# Patient Record
Sex: Female | Born: 1955
Health system: Southern US, Community
[De-identification: ages and names within clinical notes are randomized; demographics above are authoritative.]

## PROBLEM LIST (undated history)

## (undated) DIAGNOSIS — I2510 Atherosclerotic heart disease of native coronary artery without angina pectoris: Secondary | ICD-10-CM

## (undated) DIAGNOSIS — Z720 Tobacco use: Secondary | ICD-10-CM

## (undated) DIAGNOSIS — I219 Acute myocardial infarction, unspecified: Secondary | ICD-10-CM

## (undated) DIAGNOSIS — I6529 Occlusion and stenosis of unspecified carotid artery: Secondary | ICD-10-CM

## (undated) DIAGNOSIS — E785 Hyperlipidemia, unspecified: Secondary | ICD-10-CM

## (undated) DIAGNOSIS — M1990 Unspecified osteoarthritis, unspecified site: Secondary | ICD-10-CM

## (undated) HISTORY — PX: BREAST BIOPSY: SHX20

## (undated) HISTORY — DX: Occlusion and stenosis of unspecified carotid artery: I65.29

## (undated) HISTORY — DX: Atherosclerotic heart disease of native coronary artery without angina pectoris: I25.10

## (undated) HISTORY — DX: Hyperlipidemia, unspecified: E78.5

## (undated) HISTORY — DX: Tobacco use: Z72.0

---

## 2002-08-21 ENCOUNTER — Other Ambulatory Visit: Admission: RE | Admit: 2002-08-21 | Discharge: 2002-08-21 | Payer: Self-pay | Admitting: Internal Medicine

## 2005-02-07 ENCOUNTER — Ambulatory Visit (HOSPITAL_COMMUNITY): Admission: RE | Admit: 2005-02-07 | Discharge: 2005-02-07 | Payer: Self-pay | Admitting: Family Medicine

## 2006-12-01 ENCOUNTER — Emergency Department (HOSPITAL_COMMUNITY): Admission: EM | Admit: 2006-12-01 | Discharge: 2006-12-01 | Payer: Self-pay | Admitting: Emergency Medicine

## 2016-07-18 ENCOUNTER — Ambulatory Visit
Admission: RE | Admit: 2016-07-18 | Discharge: 2016-07-18 | Disposition: A | Discharge status: Home / Self Care | Payer: 59 | Source: Ambulatory Visit | Attending: Chiropractic Medicine | Admitting: Chiropractic Medicine

## 2016-07-18 ENCOUNTER — Other Ambulatory Visit: Payer: Self-pay | Admitting: Chiropractic Medicine

## 2016-07-18 DIAGNOSIS — M9903 Segmental and somatic dysfunction of lumbar region: Secondary | ICD-10-CM | POA: Insufficient documentation

## 2016-07-18 DIAGNOSIS — R52 Pain, unspecified: Secondary | ICD-10-CM

## 2016-07-18 DIAGNOSIS — R200 Anesthesia of skin: Secondary | ICD-10-CM

## 2016-07-18 DIAGNOSIS — M5442 Lumbago with sciatica, left side: Secondary | ICD-10-CM | POA: Insufficient documentation

## 2016-07-18 DIAGNOSIS — M5137 Other intervertebral disc degeneration, lumbosacral region: Secondary | ICD-10-CM | POA: Diagnosis not present

## 2016-08-14 ENCOUNTER — Other Ambulatory Visit (HOSPITAL_COMMUNITY): Payer: Self-pay | Admitting: Neurological Surgery

## 2016-08-14 DIAGNOSIS — M542 Cervicalgia: Secondary | ICD-10-CM

## 2016-08-14 DIAGNOSIS — M546 Pain in thoracic spine: Secondary | ICD-10-CM

## 2016-08-14 DIAGNOSIS — M5416 Radiculopathy, lumbar region: Secondary | ICD-10-CM

## 2016-08-15 ENCOUNTER — Other Ambulatory Visit (HOSPITAL_COMMUNITY): Payer: Self-pay | Admitting: Neurological Surgery

## 2016-08-15 DIAGNOSIS — M5416 Radiculopathy, lumbar region: Secondary | ICD-10-CM

## 2016-08-15 DIAGNOSIS — M542 Cervicalgia: Secondary | ICD-10-CM

## 2016-08-15 DIAGNOSIS — M546 Pain in thoracic spine: Secondary | ICD-10-CM

## 2016-08-24 ENCOUNTER — Ambulatory Visit (HOSPITAL_COMMUNITY): Payer: 59

## 2016-08-24 ENCOUNTER — Ambulatory Visit (HOSPITAL_COMMUNITY)
Admission: RE | Admit: 2016-08-24 | Discharge: 2016-08-24 | Disposition: A | Discharge status: Home / Self Care | Payer: 59 | Source: Ambulatory Visit | Attending: Neurological Surgery | Admitting: Neurological Surgery

## 2016-08-31 ENCOUNTER — Ambulatory Visit (HOSPITAL_COMMUNITY): Payer: 59

## 2016-08-31 ENCOUNTER — Ambulatory Visit (HOSPITAL_COMMUNITY)
Admission: RE | Admit: 2016-08-31 | Discharge: 2016-08-31 | Disposition: A | Discharge status: Home / Self Care | Payer: 59 | Source: Ambulatory Visit | Attending: Neurological Surgery | Admitting: Neurological Surgery

## 2016-08-31 DIAGNOSIS — M5416 Radiculopathy, lumbar region: Secondary | ICD-10-CM | POA: Diagnosis not present

## 2016-08-31 DIAGNOSIS — M4802 Spinal stenosis, cervical region: Secondary | ICD-10-CM | POA: Diagnosis not present

## 2016-08-31 DIAGNOSIS — M2578 Osteophyte, vertebrae: Secondary | ICD-10-CM | POA: Diagnosis not present

## 2016-08-31 DIAGNOSIS — M50221 Other cervical disc displacement at C4-C5 level: Secondary | ICD-10-CM | POA: Diagnosis not present

## 2016-08-31 DIAGNOSIS — M542 Cervicalgia: Secondary | ICD-10-CM

## 2016-09-10 ENCOUNTER — Ambulatory Visit (HOSPITAL_COMMUNITY)
Admission: RE | Admit: 2016-09-10 | Discharge: 2016-09-10 | Disposition: A | Discharge status: Home / Self Care | Payer: 59 | Source: Ambulatory Visit | Attending: Neurological Surgery | Admitting: Neurological Surgery

## 2016-09-10 DIAGNOSIS — M546 Pain in thoracic spine: Secondary | ICD-10-CM

## 2016-09-10 DIAGNOSIS — M5126 Other intervertebral disc displacement, lumbar region: Secondary | ICD-10-CM | POA: Insufficient documentation

## 2016-09-10 DIAGNOSIS — M5137 Other intervertebral disc degeneration, lumbosacral region: Secondary | ICD-10-CM | POA: Insufficient documentation

## 2019-08-23 ENCOUNTER — Inpatient Hospital Stay (HOSPITAL_COMMUNITY)
Admission: EM | Admit: 2019-08-23 | Discharge: 2019-08-25 | DRG: 247 | Disposition: A | Discharge status: Home / Self Care | Payer: Medicare Other | Attending: Internal Medicine | Admitting: Internal Medicine

## 2019-08-23 ENCOUNTER — Encounter (HOSPITAL_COMMUNITY): Payer: Self-pay | Admitting: Emergency Medicine

## 2019-08-23 ENCOUNTER — Emergency Department (HOSPITAL_COMMUNITY): Payer: Medicare Other

## 2019-08-23 ENCOUNTER — Inpatient Hospital Stay (HOSPITAL_COMMUNITY): Payer: Medicare Other

## 2019-08-23 ENCOUNTER — Other Ambulatory Visit: Payer: Self-pay

## 2019-08-23 DIAGNOSIS — Z955 Presence of coronary angioplasty implant and graft: Secondary | ICD-10-CM

## 2019-08-23 DIAGNOSIS — F17210 Nicotine dependence, cigarettes, uncomplicated: Secondary | ICD-10-CM | POA: Diagnosis present

## 2019-08-23 DIAGNOSIS — Z20822 Contact with and (suspected) exposure to covid-19: Secondary | ICD-10-CM | POA: Diagnosis present

## 2019-08-23 DIAGNOSIS — I214 Non-ST elevation (NSTEMI) myocardial infarction: Secondary | ICD-10-CM

## 2019-08-23 DIAGNOSIS — R0789 Other chest pain: Secondary | ICD-10-CM | POA: Diagnosis present

## 2019-08-23 DIAGNOSIS — Q2547 Right aortic arch: Secondary | ICD-10-CM | POA: Diagnosis not present

## 2019-08-23 DIAGNOSIS — I2510 Atherosclerotic heart disease of native coronary artery without angina pectoris: Secondary | ICD-10-CM | POA: Diagnosis not present

## 2019-08-23 DIAGNOSIS — E785 Hyperlipidemia, unspecified: Secondary | ICD-10-CM | POA: Diagnosis not present

## 2019-08-23 DIAGNOSIS — Z8249 Family history of ischemic heart disease and other diseases of the circulatory system: Secondary | ICD-10-CM | POA: Diagnosis not present

## 2019-08-23 DIAGNOSIS — R001 Bradycardia, unspecified: Secondary | ICD-10-CM | POA: Diagnosis not present

## 2019-08-23 LAB — CBC
HCT: 40.6 % (ref 36.0–46.0)
Hemoglobin: 13.5 g/dL (ref 12.0–15.0)
MCH: 31.5 pg (ref 26.0–34.0)
MCHC: 33.3 g/dL (ref 30.0–36.0)
MCV: 94.6 fL (ref 80.0–100.0)
Platelets: 208 10*3/uL (ref 150–400)
RBC: 4.29 MIL/uL (ref 3.87–5.11)
RDW: 12.6 % (ref 11.5–15.5)
WBC: 9.1 10*3/uL (ref 4.0–10.5)
nRBC: 0 % (ref 0.0–0.2)

## 2019-08-23 LAB — TROPONIN I (HIGH SENSITIVITY)
Troponin I (High Sensitivity): 38 ng/L — ABNORMAL HIGH (ref <18)
Troponin I (High Sensitivity): 275 ng/L (ref <18)
Troponin I (High Sensitivity): 1309 ng/L (ref <18)

## 2019-08-23 LAB — HEPARIN LEVEL (UNFRACTIONATED)
Heparin Unfractionated: 0.46 IU/mL (ref 0.30–0.70)
Heparin Unfractionated: 0.43 IU/mL (ref 0.30–0.70)

## 2019-08-23 LAB — COMPREHENSIVE METABOLIC PANEL
ALT: 22 U/L (ref 0–44)
AST: 21 U/L (ref 15–41)
Albumin: 3.9 g/dL (ref 3.5–5.0)
Alkaline Phosphatase: 63 U/L (ref 38–126)
Anion gap: 7 (ref 5–15)
BUN: 15 mg/dL (ref 8–23)
CO2: 27 mmol/L (ref 22–32)
Calcium: 9.5 mg/dL (ref 8.9–10.3)
Chloride: 105 mmol/L (ref 98–111)
Creatinine, Ser: 0.66 mg/dL (ref 0.44–1.00)
GFR calc Af Amer: >60 mL/min (ref >60)
GFR calc non Af Amer: >60 mL/min (ref >60)
Glucose, Bld: 129 mg/dL — ABNORMAL HIGH (ref 70–99)
Potassium: 3.6 mmol/L (ref 3.5–5.1)
Sodium: 139 mmol/L (ref 135–145)
Total Bilirubin: 0.5 mg/dL (ref 0.3–1.2)
Total Protein: 6.9 g/dL (ref 6.5–8.1)

## 2019-08-23 LAB — HIV ANTIBODY (ROUTINE TESTING W REFLEX): HIV Screen 4th Generation wRfx: NONREACTIVE

## 2019-08-23 LAB — LIPASE, BLOOD: Lipase: 28 U/L (ref 11–51)

## 2019-08-23 LAB — RESPIRATORY PANEL BY RT PCR (FLU A&B, COVID)
Influenza A by PCR: NEGATIVE
Influenza B by PCR: NEGATIVE
SARS Coronavirus 2 by RT PCR: NEGATIVE

## 2019-08-23 LAB — ECHOCARDIOGRAM COMPLETE
Height: 67 in
Weight: 2192 oz

## 2019-08-23 LAB — PROTIME-INR
INR: 1 (ref 0.8–1.2)
Prothrombin Time: 12.9 seconds (ref 11.4–15.2)

## 2019-08-23 LAB — APTT: aPTT: 33 seconds (ref 24–36)

## 2019-08-23 MED ORDER — NITROGLYCERIN 2 % TD OINT
1.0000 [in_us] | TOPICAL_OINTMENT | Freq: Once | TRANSDERMAL | Status: AC
  Administered 2019-08-23: 02:00:00 1 [in_us] via TOPICAL
  Filled 2019-08-23: qty 1

## 2019-08-23 MED ORDER — ATORVASTATIN CALCIUM 80 MG PO TABS
80.0000 mg | ORAL_TABLET | Freq: Every day | ORAL | Status: DC
  Administered 2019-08-23 – 2019-08-24 (×2): 80 mg via ORAL
  Filled 2019-08-23: qty 1
  Filled 2019-08-23: qty 2
  Filled 2019-08-23: qty 1

## 2019-08-23 MED ORDER — NITROGLYCERIN 0.4 MG SL SUBL: 0.4000 mg | SUBLINGUAL_TABLET | SUBLINGUAL | Status: DC | PRN

## 2019-08-23 MED ORDER — HEPARIN (PORCINE) 25000 UT/250ML-% IV SOLN
850.0000 [IU]/h | INTRAVENOUS | Status: DC
  Administered 2019-08-23 – 2019-08-24 (×2): 750 [IU]/h via INTRAVENOUS
  Filled 2019-08-23 (×2): qty 250

## 2019-08-23 MED ORDER — ASPIRIN EC 81 MG PO TBEC
81.0000 mg | DELAYED_RELEASE_TABLET | Freq: Every day | ORAL | Status: DC
  Administered 2019-08-24 – 2019-08-25 (×2): 81 mg via ORAL
  Filled 2019-08-23 (×2): qty 1

## 2019-08-23 MED ORDER — ONDANSETRON HCL 4 MG/2ML IJ SOLN: 4.0000 mg | Freq: Four times a day (QID) | INTRAMUSCULAR | Status: DC | PRN

## 2019-08-23 MED ORDER — METOPROLOL TARTRATE 12.5 MG HALF TABLET
12.5000 mg | ORAL_TABLET | Freq: Two times a day (BID) | ORAL | Status: DC
  Administered 2019-08-23 – 2019-08-25 (×4): 12.5 mg via ORAL
  Filled 2019-08-23 (×4): qty 1

## 2019-08-23 MED ORDER — IOHEXOL 350 MG/ML SOLN
100.0000 mL | Freq: Once | INTRAVENOUS | Status: AC | PRN
  Administered 2019-08-23: 04:00:00 100 mL via INTRAVENOUS

## 2019-08-23 MED ORDER — SODIUM CHLORIDE 0.9% FLUSH: 3.0000 mL | Freq: Once | INTRAVENOUS | Status: DC

## 2019-08-23 MED ORDER — HEPARIN BOLUS VIA INFUSION
4000.0000 [IU] | Freq: Once | INTRAVENOUS | Status: AC
  Administered 2019-08-23: 05:00:00 4000 [IU] via INTRAVENOUS

## 2019-08-23 MED ORDER — ASPIRIN 81 MG PO CHEW
324.0000 mg | CHEWABLE_TABLET | Freq: Once | ORAL | Status: AC
  Administered 2019-08-23: 02:00:00 324 mg via ORAL
  Filled 2019-08-23: qty 4

## 2019-08-23 MED ORDER — PANTOPRAZOLE SODIUM 40 MG IV SOLR
40.0000 mg | Freq: Once | INTRAVENOUS | Status: AC
  Administered 2019-08-23: 01:00:00 40 mg via INTRAVENOUS
  Filled 2019-08-23: qty 40

## 2019-08-23 MED ORDER — ACETAMINOPHEN 325 MG PO TABS: 650.0000 mg | ORAL_TABLET | ORAL | Status: DC | PRN

## 2019-08-23 MED ORDER — ALUM & MAG HYDROXIDE-SIMETH 200-200-20 MG/5ML PO SUSP
15.0000 mL | Freq: Once | ORAL | Status: AC
  Administered 2019-08-23: 01:00:00 15 mL via ORAL
  Filled 2019-08-23: qty 30

## 2019-08-23 MED ORDER — METOPROLOL TARTRATE 25 MG PO TABS: 25.0000 mg | ORAL_TABLET | Freq: Two times a day (BID) | ORAL | Status: DC

## 2019-08-23 NOTE — ED Provider Notes (Addendum)
[facility] EMERGENCY DEPARTMENT Provider Note   CSN: [id number] Arrival date & time: 08/23/19  3354     History Chief Complaint  Patient presents with  . Chest Pain    Jill Perry is a 64 y.o. female.  HPI     This is a 64 year old female with no reported past medical history who presents with chest pain.  Patient reports 3 hours of mid chest pain that radiates upward.  She describes the pain as burning.  She states that it radiates into her throat and the left shoulder.  Currently she rates her pain at 2 out of 10.  And states it got much better.  She denies any exertional component of the pain and actually states her pain improved when she got up and walked around.  No known history of reflux and she reports that she ate a small bran muffin for dinner.  Denies nausea or vomiting.  Denies shortness of breath, cough, fevers.  Patient reports that she smokes 1/2 pack of cigarettes per day for many years.  No history of diabetes, hypertension, hyperlipidemia.  History reviewed. No pertinent past medical history.  There are no problems to display for this patient.   History reviewed. No pertinent surgical history.   OB History   No obstetric history on file.     No family history on file.  Social History   Tobacco Use  . Smoking status: Current Some Day Smoker  . Smokeless tobacco: Never Used  Substance Use Topics  . Alcohol use: Never  . Drug use: Never    Home Medications Prior to Admission medications   Not on File    Allergies    Patient has no known allergies.  Review of Systems   Review of Systems  Constitutional: Negative for fever.  Respiratory: Negative for cough and shortness of breath.   Cardiovascular: Positive for chest pain. Negative for leg swelling.  Gastrointestinal: Negative for abdominal pain, diarrhea, nausea and vomiting.  Genitourinary: Negative for dysuria.  All other systems reviewed and are negative.   Physical  Exam Updated Vital Signs BP (!) 111/51   Pulse 68   Temp 97.7 F (36.5 C) (Oral)   Resp 17   Ht 1.702 m (5\' 7" )   Wt 62.1 kg   SpO2 97%   BMI 21.46 kg/m   Physical Exam Vitals and nursing note reviewed.  Constitutional:      Appearance: She is well-developed. She is not ill-appearing.  HENT:     Head: Normocephalic and atraumatic.  Eyes:     Pupils: Pupils are equal, round, and reactive to light.  Cardiovascular:     Rate and Rhythm: Normal rate and regular rhythm.     Heart sounds: Normal heart sounds.  Pulmonary:     Effort: Pulmonary effort is normal. No respiratory distress.     Breath sounds: No wheezing.  Abdominal:     General: Bowel sounds are normal.     Palpations: Abdomen is soft.     Tenderness: There is no abdominal tenderness.  Musculoskeletal:     Cervical back: Neck supple.     Right lower leg: No tenderness. No edema.     Left lower leg: No tenderness. No edema.  Skin:    General: Skin is warm and dry.  Neurological:     Mental Status: She is alert and oriented to person, place, and time.  Psychiatric:        Mood and Affect: Mood normal.  ED Results / Procedures / Treatments   Labs (all labs ordered are listed, but only abnormal results are displayed) Labs Reviewed  COMPREHENSIVE METABOLIC PANEL - Abnormal; Notable for the following components:      Result Value   Glucose, Bld 129 (*)    All other components within normal limits  TROPONIN I (HIGH SENSITIVITY) - Abnormal; Notable for the following components:   Troponin I (High Sensitivity) 38 (*)    All other components within normal limits  TROPONIN I (HIGH SENSITIVITY) - Abnormal; Notable for the following components:   Troponin I (High Sensitivity) 275 (*)    All other components within normal limits  RESPIRATORY PANEL BY RT PCR (FLU A&B, COVID)  CBC  LIPASE, BLOOD  APTT  PROTIME-INR    EKG EKG Interpretation  Date/Time:  Sunday August 23 2019 00:55:12 EST Ventricular Rate:   56 PR Interval:    QRS Duration: 102 QT Interval:  430 QTC Calculation: 415 R Axis:   74 Text Interpretation: Sinus rhythm Confirmed by Ross Marcus ([id number]) on 08/23/2019 1:32:24 AM   Radiology DG Chest 2 View  Result Date: 08/23/2019 CLINICAL DATA:  Poor one-view. Assess aortic arch. Rotated AP view earlier today. EXAM: CHEST - 2 VIEW COMPARISON:  Portable AP view earlier this day. FINDINGS: Decreased rotation from prior exam. Right aortic arch is suspected. Slight anterior deviation of the trachea on the lateral view which may represent aberrant subclavian artery. There is aortic atherosclerosis. Heart is normal in size. Mild biapical pleuroparenchymal scarring again seen. No focal airspace disease, pleural effusion, or pneumothorax. No acute osseous abnormalities are seen. There is degenerative change in the spine. IMPRESSION: Decreased rotation from exam earlier today. Right aortic arch is suspected. Slight anterior deviation of the trachea on the lateral view which may represent aberrant subclavian artery. Findings could be confirmed with chest CTA. No acute pulmonary process. Electronically Signed   By: Narda Rutherford M.D.   On: 08/23/2019 03:08   DG Chest Portable 1 View  Result Date: 08/23/2019 CLINICAL DATA:  Chest pain. EXAM: PORTABLE CHEST 1 VIEW COMPARISON:  None. FINDINGS: Patient is rotated. Heart is normal in size. Aortic arch is not well delineated, difficult to exclude right-sided arch. No pulmonary edema or focal airspace disease. Mild biapical pleuroparenchymal scarring. No pleural effusion or pneumothorax. Surgical hardware in the lower cervical spine is partially included. IMPRESSION: 1. Rotated exam. Heart is normal in size, however upper mediastinal contours are difficult to delineate due to rotation, difficult to exclude right-sided aortic arch. Recommend follow-up PA and lateral views. 2.  No acute pulmonary process. Electronically Signed   By: Narda Rutherford M.D.    On: 08/23/2019 02:06    Procedures Procedures (including critical care time)  CRITICAL CARE Performed by: Shon Baton   Total critical care time: 45 minutes  Critical care time was exclusive of separately billable procedures and treating other patients.  Critical care was necessary to treat or prevent imminent or life-threatening deterioration.  Critical care was time spent personally by me on the following activities: development of treatment plan with patient and/or surrogate as well as nursing, discussions with consultants, evaluation of patient's response to treatment, examination of patient, obtaining history from patient or surrogate, ordering and performing treatments and interventions, ordering and review of laboratory studies, ordering and review of radiographic studies, pulse oximetry and re-evaluation of patient's condition.   Medications Ordered in ED Medications  heparin bolus via infusion 4,000 Units (has no administration in time range)  Followed by  heparin ADULT infusion 100 units/mL (25000 units/216mL sodium chloride 0.45%) (has no administration in time range)  alum & mag hydroxide-simeth (MAALOX/MYLANTA) 200-200-20 MG/5ML suspension 15 mL (15 mLs Oral Given 08/23/19 0112)  pantoprazole (PROTONIX) injection 40 mg (40 mg Intravenous Given 08/23/19 0113)  aspirin chewable tablet 324 mg (324 mg Oral Given 08/23/19 0213)  nitroGLYCERIN (NITROGLYN) 2 % ointment 1 inch (1 inch Topical Given 08/23/19 0214)    ED Course  I have reviewed the triage vital signs and the nursing notes.  Pertinent labs & imaging results that were available during my care of the patient were reviewed by me and considered in my medical decision making (see chart for details).  Clinical Course as of Aug 23 511  Sun Aug 23, 2019  0230 Initial troponin is 38.  Patient with slight persistent burning pain following Maalox.  Patient was given full dose aspirin.  Will place Nitropaste.  Pain is  very atypical but she does smoke, is 63, and blood pressure is 142/71 although no prior history of hypertension.  Chest x-ray without mediastinal widening and patient does not have any signs or symptoms of DVT.   [CH]  0341 Repeat troponin 275.  Patient pain-free with Nitropaste.  Heparin IV ordered.  Patient received a full dose aspirin.  Will plan for consultation with cardiology.   [CH]  0348 Repeat chest x-ray reviewed.  Suggestive of a right aortic arch and potentially an aberrant subclavian artery.  For this reason, will obtain a CTA to further characterize anatomy.   [CH]  0406 Discussed the case with Dr. Rhae Hammock, cardiology.  Agrees with CTA.  Patient will need transfer to [facility] for cardiology evaluation.  We will plan for hospitalist evaluation given atypical symptoms and pending CTA.   [CH]    Clinical Course User Index [CH] Zamari Bonsall, Barbette Hair, MD   MDM Rules/Calculators/A&P                       Patient presents with chest pain.  Ongoing for several hours prior to evaluation.  She is overall nontoxic-appearing but is having pain.  Vital signs notable for blood pressure of 142/71.  She is afebrile.  Low suspicion for infectious etiology.  Pain is very atypical.  Burning may suggest reflux.  She does have a significant smoking history which is her main risk factor for ACS.  EKG does not show any acute ischemic changes.  Initial chest x-ray is poor quality.  Repeat chest x-ray shows possible right-sided aortic.  Initial troponin is slightly elevated.  Patient was given aspirin.  She did not become pain-free with Maalox.  Nitroglycerin paste was applied given concern for possible atypical ACS symptoms.  Patient did become pain-free with nitroglycerin paste.  Repeat troponin 275.  Given rising troponins, highly suspicious for ACS.  No risk factors for PE however, given chest x-ray findings, will obtain a CTA to further characterize her anatomy and rule out PE.  Will consult cardiology.  Patient  was updated.  On most recent updates she is comfortable appearing and in no acute distress.  She is pain-free.Heparin IV started.    5:13 AM CT scan without evidence of dissection or PE.  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  There is dilation of the subclavian.  Recommend vascular follow-up for stability.  Final Clinical Impression(s) / ED Diagnoses Final diagnoses:  NSTEMI (non-ST elevated myocardial infarction) ([place])  Atypical chest pain  Rx / DC Orders ED Discharge Orders    None       Towana Stenglein, Mayer Masker, MD 08/23/19 [id number]    Shon Baton, MD 08/23/19 [phone]

## 2019-08-23 NOTE — ED Triage Notes (Signed)
Pt states she started having chest pain "about 2 hrs ago". Pt states she" took a [place] thinking she had indigestion but it didn't do any good. Describes pain as mid-sternal and burning and radiates "up throat and to to bilateral shoulders with L being greater than right".

## 2019-08-23 NOTE — ED Notes (Signed)
[facility] called spoke with Michele Mcalpine to inform of patient bed ready.

## 2019-08-23 NOTE — Consult Note (Signed)
Cardiology Consultation:   Patient ID: Jill Perry MRN: [id number]; DOB: 10/12/1955  Admit date: 08/23/2019 Date of Consult: 08/23/2019  Primary Care Provider: Patient, No Pcp Per Primary Cardiologist: No primary care provider on file.  Primary Electrophysiologist:  None    Patient Profile:   Jill Perry is a 64 y.o. female with no significant PMHx who is being seen today for the evaluation of CP/NSTEMI at the request of [facility] (Dr. Rebekah ChesterfieldLaxman).  History of Present Illness:   Jill Perry has no significant PMHx; she presented to [facility] yesterday evening c/o CP. Upon arrival at [facility], pt reported 3 hours of mid chest pain that radiated upward.  She described the pain as burning.  She states that it radiates into her throat and the left shoulder.   She denied any exertional component of the pain and actually states her pain improved when she got up and walked around.  Denies associated nausea or vomiting.  Denies shortness of breath, cough, fevers.  Patient reports that she smokes 1/2 pack of cigarettes per day for many years.  No history of diabetes, hypertension, hyperlipidemia.  Heart Pathway Score:     History reviewed. No pertinent past medical history.  History reviewed. No pertinent surgical history.   Home Medications:  Prior to Admission medications   Medication Sig Start Date End Date Taking? Authorizing Provider  OVER THE COUNTER MEDICATION See admin instructions. Multivitamin Pack - 5 Tablets once daily   Yes [provider]   Pt was not on any prescription medications prior to this admission.  Allergies:   No Known Allergies  Social History:   Social History   Socioeconomic History  . Marital status: Married    Spouse name: Not on file  . Number of children: Not on file  . Years of education: Not on file  . Highest education level: Not on file  Occupational History  . Not on file  Tobacco Use  . Smoking status: Current Some Day  Smoker  . Smokeless tobacco: Never Used  Substance and Sexual Activity  . Alcohol use: Never  . Drug use: Never  . Sexual activity: Not Currently  Other Topics Concern  . Not on file  Social History Narrative  . Not on file   Social Determinants of Health   Financial Resource Strain:   . Difficulty of Paying Living Expenses: Not on file  Food Insecurity:   . Worried About Programme researcher, broadcasting/film/videounning Out of Food in the Last Year: Not on file  . Ran Out of Food in the Last Year: Not on file  Transportation Needs:   . Lack of Transportation (Medical): Not on file  . Lack of Transportation (Non-Medical): Not on file  Physical Activity:   . Days of Exercise per Week: Not on file  . Minutes of Exercise per Session: Not on file  Stress:   . Feeling of Stress : Not on file  Social Connections:   . Frequency of Communication with Friends and Family: Not on file  . Frequency of Social Gatherings with Friends and Family: Not on file  . Attends Religious Services: Not on file  . Active Member of Clubs or Organizations: Not on file  . Attends BankerClub or Organization Meetings: Not on file  . Marital Status: Not on file  Intimate Partner Violence:   . Fear of Current or Ex-Partner: Not on file  . Emotionally Abused: Not on file  . Physically Abused: Not on file  . Sexually Abused:  Not on file    Family History:   Pt states her brother was diagnosed w/ early CAD. FHx o/w non-contributory  ROS:  Please see the history of present illness.  All other ROS reviewed and negative.     Physical Exam/Data:   Vitals:   08/23/19 1300 08/23/19 1430 08/23/19 1545 08/23/19 1648  BP: (!) 109/59  110/60 (!) 115/102  Pulse: 63 (!) 57 69 (!) 56  Resp: 16 13 14    Temp:   98 F (36.7 C) 98.3 F (36.8 C)  TempSrc:    Oral  SpO2: 99% 98% 99% 97%  Weight:      Height:        Intake/Output Summary (Last 24 hours) at 08/23/2019 1943 Last data filed at 08/23/2019 1700 Gross per 24 hour  Intake 89.58 ml  Output --   Net 89.58 ml   Last 3 Weights 08/23/2019  Weight (lbs) 137 lb  Weight (kg) 62.143 kg     Body mass index is 21.46 kg/m.  General:  Well nourished, well developed, in no acute distress HEENT: normal Lymph: no adenopathy Neck: no JVD Endocrine:  No thryomegaly Vascular: No carotid bruits; DP pulses 1+ bilaterally   Cardiac:  normal S1, S2; RRR; no murmur  Lungs:  clear to auscultation bilaterally, no wheezing, rhonchi or rales  Abd: soft, nontender, no hepatomegaly  Ext: no edema Musculoskeletal:  No deformities, BUE and BLE strength normal and equal Skin: warm and dry  Neuro:  CNs 2-12 intact, no focal abnormalities noted Psych:  Normal affect   EKG:  The EKG was personally reviewed and demonstrates:  SB with HR 56, no acute ST changes   Relevant CV Studies: none  Laboratory Data:  High Sensitivity Troponin:   Recent Labs  Lab 08/23/19 0100 08/23/19 0257 08/23/19 0916  TROPONINIHS 38* 275* 1,309*     Chemistry Recent Labs  Lab 08/23/19 0100  NA 139  K 3.6  CL 105  CO2 27  GLUCOSE 129*  BUN 15  CREATININE 0.66  CALCIUM 9.5  GFRNONAA >60  GFRAA >60  ANIONGAP 7    Recent Labs  Lab 08/23/19 0100  PROT 6.9  ALBUMIN 3.9  AST 21  ALT 22  ALKPHOS 63  BILITOT 0.5   Hematology Recent Labs  Lab 08/23/19 0100  WBC 9.1  RBC 4.29  HGB 13.5  HCT 40.6  MCV 94.6  MCH 31.5  MCHC 33.3  RDW 12.6  PLT 208   BNPNo results for input(s): BNP, PROBNP in the last 168 hours.  DDimer No results for input(s): DDIMER in the last 168 hours.   Radiology/Studies:  DG Chest 2 View  Result Date: 08/23/2019 CLINICAL DATA:  Poor one-view. Assess aortic arch. Rotated AP view earlier today. EXAM: CHEST - 2 VIEW COMPARISON:  Portable AP view earlier this day. FINDINGS: Decreased rotation from prior exam. Right aortic arch is suspected. Slight anterior deviation of the trachea on the lateral view which may represent aberrant subclavian artery. There is aortic  atherosclerosis. Heart is normal in size. Mild biapical pleuroparenchymal scarring again seen. No focal airspace disease, pleural effusion, or pneumothorax. No acute osseous abnormalities are seen. There is degenerative change in the spine. IMPRESSION: Decreased rotation from exam earlier today. Right aortic arch is suspected. Slight anterior deviation of the trachea on the lateral view which may represent aberrant subclavian artery. Findings could be confirmed with chest CTA. No acute pulmonary process. Electronically Signed   By: Aurther Loft.D.  On: 08/23/2019 03:08   CT Angio Chest PE W and/or Wo Contrast  Result Date: 08/23/2019 CLINICAL DATA:  Patient with chest pain. EXAM: CT ANGIOGRAPHY CHEST WITH CONTRAST TECHNIQUE: Multidetector CT imaging of the chest was performed using the standard protocol during bolus administration of intravenous contrast. Multiplanar CT image reconstructions and MIPs were obtained to evaluate the vascular anatomy. CONTRAST:  OMNIPAQUE IOHEXOL 350 MG/ML SOLN COMPARISON:  None. FINDINGS: Cardiovascular: Normal heart size. No pericardial effusion. Right-sided aortic arch is demonstrated. Aberrant left subclavian artery is demonstrated coursing off the aortic arch posterior to the trachea and esophagus. There is a focal dilatation of the proximal subclavian artery at the origin from the aortic arch measuring 1.5 cm (image 29; series 4) most compatible with a Kommerell diverticula. There is peripheral atherosclerosis involving the diverticula. The immediate adjacent proximal aspect of the left subclavian artery is stenosed and markedly narrowed. Adequate opacification of the pulmonary arterial system. No intraluminal filling defects identified to suggest acute pulmonary embolus. Mediastinum/Nodes: No enlarged axillary, mediastinal or hilar lymphadenopathy. Small hiatal hernia. Lungs/Pleura: Central airways are patent. Biapical pleuroparenchymal thickening and scarring.  No large area pulmonary consolidation. No pleural effusion or pneumothorax. Upper Abdomen: No acute process. Musculoskeletal: Thoracic spine degenerative changes. No aggressive or acute appearing osseous lesions. Review of the MIP images confirms the above findings. IMPRESSION: 1. No evidence for acute pulmonary embolus. 2. 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 There is a focal dilatation of the proximal aspect of the left subclavian artery at the origin of the aortic arch measuring up to 1.5 cm (Kommerell diverticulum). Consider outpatient vascular/thoracic surgical follow-up. Additionally, follow-up CT chest could be performed in 3 months to assess for stability. 3. Aortic Atherosclerosis (ICD10-I70.0). Electronically Signed   By: Annia Belt M.D.   On: 08/23/2019 05:08   DG Chest Portable 1 View  Result Date: 08/23/2019 CLINICAL DATA:  Chest pain. EXAM: PORTABLE CHEST 1 VIEW COMPARISON:  None. FINDINGS: Patient is rotated. Heart is normal in size. Aortic arch is not well delineated, difficult to exclude right-sided arch. No pulmonary edema or focal airspace disease. Mild biapical pleuroparenchymal scarring. No pleural effusion or pneumothorax. Surgical hardware in the lower cervical spine is partially included. IMPRESSION: 1. Rotated exam. Heart is normal in size, however upper mediastinal contours are difficult to delineate due to rotation, difficult to exclude right-sided aortic arch. Recommend follow-up PA and lateral views. 2.  No acute pulmonary process. Electronically Signed   By: Narda Rutherford M.D.   On: 08/23/2019 02:06   ECHOCARDIOGRAM COMPLETE  Result Date: 08/23/2019   ECHOCARDIOGRAM REPORT   Patient Name:   Jill Perry Date of Exam: 08/23/2019 Medical Rec #:  [id number]          Height:       67.0 in Accession #:    [id number]         Weight:       137.0 lb Date of Birth:  August 17, 1955           BSA:          1.72 m Patient Age:    63 years           BP:           109/64 mmHg Patient Gender: F                  HR:  50 bpm. [name] Procedure: 2D Echo, Color Doppler and Cardiac Doppler Indications:    NSTEMI  History:        Patient has no prior history of Echocardiogram examinations.                 Risk Factors:Current Smoker.  Sonographer:    Irving Burton Senior RDCS Referring Phys: [id number] Lillie Columbia M GADHIA IMPRESSIONS  1. Left ventricular ejection fraction, by visual estimation, is 60 to 65%. The left ventricle has normal function. There is no left ventricular hypertrophy.  2. The left ventricle has no regional wall motion abnormalities.  3. Global right ventricle has normal systolic function.The right ventricular size is normal. No increase in right ventricular wall thickness.  4. Left atrial size was normal.  5. Right atrial size was normal.  6. The mitral valve is normal in structure. No evidence of mitral valve regurgitation. No evidence of mitral stenosis.  7. The tricuspid valve is normal in structure.  8. The tricuspid valve is normal in structure. Tricuspid valve regurgitation is not demonstrated.  9. The aortic valve is tricuspid. Aortic valve regurgitation is not visualized. No evidence of aortic valve sclerosis or stenosis. 10. The pulmonic valve was not well visualized. Pulmonic valve regurgitation is not visualized. 11. The inferior vena cava is normal in size with greater than 50% respiratory variability, suggesting right atrial pressure of 3 mmHg. FINDINGS  Left Ventricle: Left ventricular ejection fraction, by visual estimation, is 60 to 65%. The left ventricle has normal function. The left ventricle has no regional wall motion abnormalities. There is no left ventricular hypertrophy. Right Ventricle: The right ventricular size is normal. No increase in right ventricular wall thickness. Global RV systolic function is has normal systolic function. Left Atrium: Left  atrial size was normal in size. Right Atrium: Right atrial size was normal in size Pericardium: There is no evidence of pericardial effusion. Mitral Valve: The mitral valve is normal in structure. No evidence of mitral valve regurgitation. No evidence of mitral valve stenosis by observation. Tricuspid Valve: The tricuspid valve is normal in structure. Tricuspid valve regurgitation is not demonstrated. Aortic Valve: The aortic valve is tricuspid. Aortic valve regurgitation is not visualized. The aortic valve is structurally normal, with no evidence of sclerosis or stenosis. Aortic valve mean gradient measures 3.3 mmHg. Aortic valve peak gradient measures 6.5 mmHg. Aortic valve area, by VTI measures 2.34 cm. Pulmonic Valve: The pulmonic valve was not well visualized. Pulmonic valve regurgitation is not visualized. Pulmonic regurgitation is not visualized. No evidence of pulmonic stenosis. Aorta: The aortic root is normal in size and structure. Pulmonary Artery: Indeterminant PASP, inadequate TR jet. Venous: The inferior vena cava is normal in size with greater than 50% respiratory variability, suggesting right atrial pressure of 3 mmHg. IAS/Shunts: No atrial level shunt detected by color flow Doppler.  LEFT VENTRICLE PLAX 2D LVIDd:         4.86 cm  Diastology LVIDs:         4.22 cm  LV e' lateral:   10.90 cm/s LV PW:         0.67 cm  LV E/e' lateral: 8.1 LV IVS:        0.61 cm  LV e' medial:    7.40 cm/s LVOT diam:     1.90 cm  LV E/e' medial:  11.9 LV SV:         31 ml LV SV Index:   18.22 LVOT Area:  2.84 cm  RIGHT VENTRICLE RV S prime:     10.70 cm/s TAPSE (M-mode): 2.0 cm LEFT ATRIUM             Index       RIGHT ATRIUM           Index LA diam:        3.00 cm 1.74 cm/m  RA Area:     16.70 cm LA Vol (A2C):   40.3 ml 23.40 ml/m RA Volume:   47.30 ml  27.47 ml/m LA Vol (A4C):   22.6 ml 13.13 ml/m LA Biplane Vol: 32.1 ml 18.64 ml/m  AORTIC VALVE AV Area (Vmax):    2.19 cm AV Area (Vmean):   2.19 cm AV  Area (VTI):     2.34 cm AV Vmax:           127.02 cm/s AV Vmean:          86.015 cm/s AV VTI:            0.300 m AV Peak Grad:      6.5 mmHg AV Mean Grad:      3.3 mmHg LVOT Vmax:         98.30 cm/s LVOT Vmean:        66.500 cm/s LVOT VTI:          0.247 m LVOT/AV VTI ratio: 0.82  AORTA Ao Root diam: 2.30 cm MITRAL VALVE MV Area (PHT): 3.37 cm              SHUNTS MV PHT:        65.25 msec            Systemic VTI:  0.25 m MV Decel Time: 225 msec              Systemic Diam: 1.90 cm MV E velocity: 87.80 cm/s  103 cm/s MV A velocity: 101.00 cm/s 70.3 cm/s MV E/A ratio:  0.87        1.5  Dina Rich MD Electronically signed by Dina Rich MD Signature Date/Time: 08/23/2019/12:34:25 PM    Final        TIMI Risk Score for Unstable Angina or Non-ST Elevation MI:   The patient's TIMI risk score is 3, which indicates a 13% risk of all cause mortality, new or recurrent myocardial infarction or need for urgent revascularization in the next 14 days.   Assessment and Plan:   1. CP/NSTEMI: most recent HS trop >1000, pt is pain-free. No acute EKG changes. Will plan for LHC in the AM for further eval. NPO after MN tonight. TTE in AM.  FLP, TSH, HgbA1c for risk factor stratification. 2. tob abuse: encourage smoking cessation      For questions or updates, please contact [facility] Please consult [email or web address] for contact info under     Signed, Precious Reel, MD, [facility] 08/23/2019 7:43 PM

## 2019-08-23 NOTE — ED Notes (Signed)
Echo in progress at bedside at this time. 

## 2019-08-23 NOTE — Progress Notes (Signed)
ANTICOAGULATION CONSULT NOTE - Preliminary  Pharmacy Consult for heparin Indication: chest pain/ACS  No Known Allergies  Patient Measurements: Height: 5\' 7"  (170.2 cm) Weight: 137 lb (62.1 kg) IBW/kg (Calculated) : 61.6 HEPARIN DW (KG): 62.1   Vital Signs: Temp: 97.7 F (36.5 C) (01/31 0057) Temp Source: Oral (01/31 0057) BP: 109/58 (01/31 1000) Pulse Rate: 56 (01/31 1030)  Labs: Recent Labs    08/23/19 0100 08/23/19 0916  HGB 13.5  --   HCT 40.6  --   PLT 208  --   APTT 33  --   LABPROT 12.9  --   INR 1.0  --   HEPARINUNFRC  --  0.46  CREATININE 0.66  --    Estimated Creatinine Clearance: 68.2 mL/min (by C-G formula based on SCr of 0.66 mg/dL).  Medical History: History reviewed. No pertinent past medical history.  Medications:  Infusions:  . heparin 750 Units/hr (08/23/19 0548)   PRN:   Assessment: Pt in ED with chest pain and mid-sternal burning that radiates to shoulders, troponin elevated.  Starting heparin gtt per pharmacy. No PTA meds, baseline labs pending, plts ok.   HL 0.46   Goal of Therapy:  Heparin level 0.3-0.7 units/ml   Plan:  Continue heparin infusion at 750 units/hr Check anti-Xa level in 6 hours and daily while on heparin Continue to monitor H&H and platelets   08/25/19 Apryle Stowell, RPH 08/23/2019,11:00 AM

## 2019-08-23 NOTE — ED Notes (Signed)
Date and time results received: 08/23/19 @ 0339  Test: Troponin Critical Value:275 Name of Provider Notified:Dr Horton Orders Received? Yes Or Actions Taken?: Actions Taken: orders carried out

## 2019-08-23 NOTE — Progress Notes (Addendum)
   Patient seen and evaluated, chart reviewed, please see EMR for updated orders. Please see full H&P dictated by admitting physician Dr. Teodoro Kil for same date of service.    -Sonographer at bedside -64 year old female with past medical history relevant for tobacco abuse (> 30 pack years) admitted with chest pains radiating to the left shoulder with elevation of troponin -Admitting provider (Dr. Naoma Diener with on-call cardiology provider at [facility] -Plan is to transfer to [facility] for further cardiovascular evaluation including possible LHC -CTA chest without PE -Currently chest pain-free -Continue aspirin, Lipitor and IV heparin, decrease metoprolol to 12.5 mg twice daily due to bradycardia -Echo report pending, repeat troponin and repeat EKG ordered and pending --Patient remains n.p.o., awaiting transfer to [facility] as noted above for possible NSTEMI -Covid negative  --Troponin trending up, EKG nonacute, patient remains chest pain-free, -I Discussed case with Dr. Hillis Range, who reviewed patient's chart--concurs with current treatment plan and pending transfer to [facility] for possible LHC  Shon Hale, MD

## 2019-08-23 NOTE — Progress Notes (Signed)
Echocardiogram 2D Echocardiogram has been performed.  Jill Perry 08/23/2019, 8:55 AM

## 2019-08-23 NOTE — ED Notes (Signed)
Date and time results received: 08/23/19 1042 (use smartphrase ".now" to insert current time)  Test: Trop  Critical Value: 1309  Name of Provider Notified: Dr. Criss Alvine and Dr. Darrell Jewel Orders Received? Or Actions Taken?: Actions Taken: none No new orders recieved

## 2019-08-23 NOTE — Progress Notes (Signed)
ANTICOAGULATION CONSULT NOTE - Preliminary  Pharmacy Consult for heparin Indication: chest pain/ACS  No Known Allergies  Patient Measurements: Height: 5\' 7"  (170.2 cm) Weight: 137 lb (62.1 kg) IBW/kg (Calculated) : 61.6 HEPARIN DW (KG): 62.1   Vital Signs: Temp: 97.7 F (36.5 C) (01/31 0057) Temp Source: Oral (01/31 0057) BP: 111/51 (01/31 0333) Pulse Rate: 68 (01/31 0333)  Labs: Recent Labs    08/23/19 0100  HGB 13.5  HCT 40.6  PLT 208  CREATININE 0.66   Estimated Creatinine Clearance: 68.2 mL/min (by C-G formula based on SCr of 0.66 mg/dL).  Medical History: History reviewed. No pertinent past medical history.  Medications:  Infusions:  . heparin     PRN:   Assessment: Pt in ED with chest pain and mid-sternal burning that radiates to shoulders, troponin elevated.  Starting heparin gtt per pharmacy. No PTA meds, baseline labs pending, plts ok.   Goal of Therapy:  Heparin level 0.3-0.7 units/ml   Plan:  Give 4000 units bolus x 1 Start heparin infusion at 750 units/hr Check anti-Xa level in 6 hours and daily while on heparin Continue to monitor H&H and platelets Preliminary review of pertinent patient information completed.  08/25/19 clinical pharmacist will complete review during morning rounds to assess the patient and finalize treatment regimen.  [name], RPH 08/23/2019,3:46 AM

## 2019-08-23 NOTE — H&P (Signed)
History and Physical    Patient Demographics:    Jill Perry:[id number] DOB: 24-May-1956 DOA: 08/23/2019  PCP: Patient, No Pcp Per  Patient coming from: Home  I have personally briefly reviewed patient's old medical records in [place]  Chief Complaint: Chest pain   Assessment & Plan:     Assessment/Plan Active Problems:   NSTEMI (non-ST elevated myocardial infarction) ([place])     Principal Problem: Acute NSTEMI: Patient presented with central chest pain radiating to left shoulder of 3 hours duration.  No other significant associated symptoms. No past medical history.  Noted to be 1/2 pack a day smoker with a 15-pack-year smoking history.  EKG is nonischemic.  First troponin noted to be 38 with repeat of 275.  Case discussed with cardiology on-call who recommended transfer to [facility] for potential cardiac cath and admission to the hospitalist service. -Telemetry monitoring -Serial troponins -We will place on aspirin, statin, beta-blockers -Heparin infusion -Cardiology consult on transfer to [facility]  Other Active Problems: Nicotine dependence: Smoking cessation advised, pt says she has decided to quit   DVT prophylaxis: Lovenox Code Status:  Full code Family Communication: N/A  Disposition Plan: Admitted as inpatient for acute NSTEMI  Consults called: N/A Admission status: inpatient status     HPI:     HPI: Jill Perry is a 64 y.o. female with no significant past medical history who presented to the ER with chest pain.  Patient reported 3 hours of central chest pain which was reported as burning.  Pain was noted to be radiating to the left shoulder as well as to her jaw.  No associated shortness of breath. No fever, chills, cough, nausea, vomiting, abdominal pain, dysuria, palpitations, dizziness, lightheadedness, seizures, syncope.  No known significant cardiac risk factors. ED Course:  Vital Signs reviewed on presentation, significant for  temperature 97.7, heart rate 68, blood pressure 111/51, saturation 97% on room air. Labs reviewed, significant for sodium 139, potassium 3.6, BUN 15, creatinine 0.6, first troponin 38, repeat 275.  LFTs within normal limits, lipase 28.  WBC count 9.1, hemoglobin 13.5, hematocrit 40, platelets 208, INR 1.0.  SARS Covid RT-PCR is negative. Imaging personally Reviewed, chest x-ray shows no acute cardiac disease or pulmonary infiltrates.  Right aortic arch is suspected.  Slight anterior deviation of the trachea on the lateral view which may represent aberrant subclavian artery. EKG personally reviewed, shows sinus rhythm, no acute ST-T changes.    Review of systems:    Review of Systems: As per HPI otherwise 10 point review of systems negative.  All other review of systems is negative except the ones noted above in the HPI.    Past Medical and Surgical History:  Reviewed by me  History reviewed. No pertinent past medical history.  History reviewed. No pertinent surgical history.   Social History:  Reviewed by me   reports that she has been smoking. She has never used smokeless tobacco. She reports that she does not drink alcohol or use drugs.  Allergies:    No Known Allergies  Family History :   No family history on file. Family history reviewed, noted as above, not pertinent to current presentation.   Home Medications:    Prior to Admission medications   Not on File    Physical Exam:    Physical Exam: Vitals:   08/23/19 0200 08/23/19 0215 08/23/19 0330 08/23/19 0333  BP: (!) 146/75   (!) 111/51  Pulse: 64 68 69 68  Resp: 14  17 13 17   Temp:      TempSrc:      SpO2: 98% 100% 99% 97%  Weight:      Height:        Constitutional: NAD, calm, comfortable Vitals:   08/23/19 0200 08/23/19 0215 08/23/19 0330 08/23/19 0333  BP: (!) 146/75   (!) 111/51  Pulse: 64 68 69 68  Resp: 14 17 13 17   Temp:      TempSrc:      SpO2: 98% 100% 99% 97%  Weight:      Height:        Eyes: PERRL, lids and conjunctivae normal ENMT: Mucous membranes are moist. Posterior pharynx clear of any exudate or lesions.Normal dentition.  Neck: normal, supple, no masses, no thyromegaly Respiratory: clear to auscultation bilaterally, no wheezing, no crackles. Normal respiratory effort. No accessory muscle use.  Cardiovascular: Regular rate and rhythm, no murmurs / rubs / gallops. No extremity edema. 2+ pedal pulses. No carotid bruits.  Abdomen: no tenderness, no masses palpated. No hepatosplenomegaly. Bowel sounds positive.  Musculoskeletal: no clubbing / cyanosis. No joint deformity upper and lower extremities. Good ROM, no contractures. Normal muscle tone.  Skin: no rashes, lesions, ulcers. No induration Neurologic: CN 2-12 grossly intact. Sensation intact, DTR normal. Strength 5/5 in all 4.  Psychiatric: Normal judgment and insight. Alert and oriented x 3. Normal mood.    Decubitus Ulcers: Not present on admission Catheters and tubes: None  Data Review:    Labs on Admission: I have personally reviewed following labs and imaging studies  CBC: Recent Labs  Lab 08/23/19 0100  WBC 9.1  HGB 13.5  HCT 40.6  MCV 94.6  PLT 208   Basic Metabolic Panel: Recent Labs  Lab 08/23/19 0100  NA 139  K 3.6  CL 105  CO2 27  GLUCOSE 129*  BUN 15  CREATININE 0.66  CALCIUM 9.5   GFR: Estimated Creatinine Clearance: 68.2 mL/min (by C-G formula based on SCr of 0.66 mg/dL). Liver Function Tests: Recent Labs  Lab 08/23/19 0100  AST 21  ALT 22  ALKPHOS 63  BILITOT 0.5  PROT 6.9  ALBUMIN 3.9   Recent Labs  Lab 08/23/19 0100  LIPASE 28   No results for input(s): AMMONIA in the last 168 hours. Coagulation Profile: Recent Labs  Lab 08/23/19 0100  INR 1.0   Cardiac Enzymes: No results for input(s): CKTOTAL, CKMB, CKMBINDEX, TROPONINI in the last 168 hours. BNP (last 3 results) No results for input(s): PROBNP in the last 8760 hours. HbA1C: No results for input(s):  HGBA1C in the last 72 hours. CBG: No results for input(s): GLUCAP in the last 168 hours. Lipid Profile: No results for input(s): CHOL, HDL, LDLCALC, TRIG, CHOLHDL, LDLDIRECT in the last 72 hours. Thyroid Function Tests: No results for input(s): TSH, T4TOTAL, FREET4, T3FREE, THYROIDAB in the last 72 hours. Anemia Panel: No results for input(s): VITAMINB12, FOLATE, FERRITIN, TIBC, IRON, RETICCTPCT in the last 72 hours. Urine analysis: No results found for: COLORURINE, APPEARANCEUR, LABSPEC, PHURINE, GLUCOSEU, HGBUR, BILIRUBINUR, KETONESUR, PROTEINUR, UROBILINOGEN, NITRITE, LEUKOCYTESUR   Imaging Results:      Radiological Exams on Admission: DG Chest 2 View  Result Date: 08/23/2019 CLINICAL DATA:  Poor one-view. Assess aortic arch. Rotated AP view earlier today. EXAM: CHEST - 2 VIEW COMPARISON:  Portable AP view earlier this day. FINDINGS: Decreased rotation from prior exam. Right aortic arch is suspected. Slight anterior deviation of the trachea on the lateral view which may represent aberrant subclavian artery.  There is aortic atherosclerosis. Heart is normal in size. Mild biapical pleuroparenchymal scarring again seen. No focal airspace disease, pleural effusion, or pneumothorax. No acute osseous abnormalities are seen. There is degenerative change in the spine. IMPRESSION: Decreased rotation from exam earlier today. Right aortic arch is suspected. Slight anterior deviation of the trachea on the lateral view which may represent aberrant subclavian artery. Findings could be confirmed with chest CTA. No acute pulmonary process. Electronically Signed   By: Narda Rutherford M.D.   On: 08/23/2019 03:08   DG Chest Portable 1 View  Result Date: 08/23/2019 CLINICAL DATA:  Chest pain. EXAM: PORTABLE CHEST 1 VIEW COMPARISON:  None. FINDINGS: Patient is rotated. Heart is normal in size. Aortic arch is not well delineated, difficult to exclude right-sided arch. No pulmonary edema or focal airspace  disease. Mild biapical pleuroparenchymal scarring. No pleural effusion or pneumothorax. Surgical hardware in the lower cervical spine is partially included. IMPRESSION: 1. Rotated exam. Heart is normal in size, however upper mediastinal contours are difficult to delineate due to rotation, difficult to exclude right-sided aortic arch. Recommend follow-up PA and lateral views. 2.  No acute pulmonary process. Electronically Signed   By: Narda Rutherford M.D.   On: 08/23/2019 02:06      [facility] Cyndie Chime MD [facility]  If 7PM-7AM, please contact night-coverage   08/23/2019, 4:19 AM

## 2019-08-24 ENCOUNTER — Encounter (HOSPITAL_COMMUNITY)
Admission: EM | Disposition: A | Discharge status: Home / Self Care | Payer: Self-pay | Source: Home / Self Care | Attending: Internal Medicine

## 2019-08-24 DIAGNOSIS — R001 Bradycardia, unspecified: Secondary | ICD-10-CM

## 2019-08-24 DIAGNOSIS — I2510 Atherosclerotic heart disease of native coronary artery without angina pectoris: Secondary | ICD-10-CM

## 2019-08-24 DIAGNOSIS — E785 Hyperlipidemia, unspecified: Secondary | ICD-10-CM

## 2019-08-24 HISTORY — PX: CORONARY STENT INTERVENTION: CATH118234

## 2019-08-24 HISTORY — PX: LEFT HEART CATH AND CORONARY ANGIOGRAPHY: CATH118249

## 2019-08-24 LAB — POCT ACTIVATED CLOTTING TIME
Activated Clotting Time: 257 seconds
Activated Clotting Time: 368 seconds
Activated Clotting Time: 555 seconds

## 2019-08-24 LAB — CBC
HCT: 41.7 % (ref 36.0–46.0)
Hemoglobin: 13.8 g/dL (ref 12.0–15.0)
MCH: 31.6 pg (ref 26.0–34.0)
MCHC: 33.1 g/dL (ref 30.0–36.0)
MCV: 95.4 fL (ref 80.0–100.0)
Platelets: 203 10*3/uL (ref 150–400)
RBC: 4.37 MIL/uL (ref 3.87–5.11)
RDW: 12.5 % (ref 11.5–15.5)
WBC: 7.4 10*3/uL (ref 4.0–10.5)
nRBC: 0 % (ref 0.0–0.2)

## 2019-08-24 LAB — LIPID PANEL
Cholesterol: 244 mg/dL — ABNORMAL HIGH (ref 0–200)
HDL: 55 mg/dL (ref >40)
LDL Cholesterol: 171 mg/dL — ABNORMAL HIGH (ref 0–99)
Total CHOL/HDL Ratio: 4.4 RATIO
Triglycerides: 88 mg/dL (ref <150)
VLDL: 18 mg/dL (ref 0–40)

## 2019-08-24 LAB — BASIC METABOLIC PANEL
Anion gap: 11 (ref 5–15)
BUN: 11 mg/dL (ref 8–23)
CO2: 24 mmol/L (ref 22–32)
Calcium: 9.2 mg/dL (ref 8.9–10.3)
Chloride: 110 mmol/L (ref 98–111)
Creatinine, Ser: 0.73 mg/dL (ref 0.44–1.00)
GFR calc Af Amer: >60 mL/min (ref >60)
GFR calc non Af Amer: >60 mL/min (ref >60)
Glucose, Bld: 98 mg/dL (ref 70–99)
Potassium: 4 mmol/L (ref 3.5–5.1)
Sodium: 145 mmol/L (ref 135–145)

## 2019-08-24 LAB — HEPARIN LEVEL (UNFRACTIONATED): Heparin Unfractionated: 0.23 IU/mL — ABNORMAL LOW (ref 0.30–0.70)

## 2019-08-24 SURGERY — LEFT HEART CATH AND CORONARY ANGIOGRAPHY
Anesthesia: LOCAL

## 2019-08-24 MED ORDER — TICAGRELOR 90 MG PO TABS
ORAL_TABLET | ORAL | Status: DC | PRN
  Administered 2019-08-24: 180 mg via ORAL

## 2019-08-24 MED ORDER — TICAGRELOR 90 MG PO TABS
ORAL_TABLET | ORAL | Status: AC
  Filled 2019-08-24: qty 1

## 2019-08-24 MED ORDER — ACETAMINOPHEN 325 MG PO TABS: 650.0000 mg | ORAL_TABLET | ORAL | Status: DC | PRN

## 2019-08-24 MED ORDER — VERAPAMIL HCL 2.5 MG/ML IV SOLN
INTRAVENOUS | Status: DC | PRN
  Administered 2019-08-24: 10 mL via INTRA_ARTERIAL

## 2019-08-24 MED ORDER — IOHEXOL 350 MG/ML SOLN
INTRAVENOUS | Status: DC | PRN
  Administered 2019-08-24: 275 mL via INTRA_ARTERIAL

## 2019-08-24 MED ORDER — SODIUM CHLORIDE 0.9% FLUSH
3.0000 mL | Freq: Two times a day (BID) | INTRAVENOUS | Status: DC
  Administered 2019-08-24 – 2019-08-25 (×2): 3 mL via INTRAVENOUS

## 2019-08-24 MED ORDER — ONDANSETRON HCL 4 MG/2ML IJ SOLN: 4.0000 mg | Freq: Four times a day (QID) | INTRAMUSCULAR | Status: DC | PRN

## 2019-08-24 MED ORDER — VERAPAMIL HCL 2.5 MG/ML IV SOLN
INTRAVENOUS | Status: AC
  Filled 2019-08-24: qty 2

## 2019-08-24 MED ORDER — ATORVASTATIN CALCIUM 80 MG PO TABS: 80.0000 mg | ORAL_TABLET | Freq: Every day | ORAL | Status: DC

## 2019-08-24 MED ORDER — HEPARIN (PORCINE) IN NACL 1000-0.9 UT/500ML-% IV SOLN
INTRAVENOUS | Status: AC
  Filled 2019-08-24: qty 500

## 2019-08-24 MED ORDER — MIDAZOLAM HCL 2 MG/2ML IJ SOLN
INTRAMUSCULAR | Status: AC
  Filled 2019-08-24: qty 2

## 2019-08-24 MED ORDER — NITROGLYCERIN 1 MG/10 ML FOR IR/CATH LAB
INTRA_ARTERIAL | Status: DC | PRN
  Administered 2019-08-24 (×2): 200 ug via INTRACORONARY

## 2019-08-24 MED ORDER — LIDOCAINE HCL (PF) 1 % IJ SOLN
INTRAMUSCULAR | Status: DC | PRN
  Administered 2019-08-24: 5 mL via INTRADERMAL

## 2019-08-24 MED ORDER — FENTANYL CITRATE (PF) 100 MCG/2ML IJ SOLN
INTRAMUSCULAR | Status: AC
  Filled 2019-08-24: qty 2

## 2019-08-24 MED ORDER — SODIUM CHLORIDE 0.9% FLUSH: 3.0000 mL | INTRAVENOUS | Status: DC | PRN

## 2019-08-24 MED ORDER — TICAGRELOR 90 MG PO TABS
90.0000 mg | ORAL_TABLET | Freq: Two times a day (BID) | ORAL | Status: DC
  Administered 2019-08-24 – 2019-08-25 (×2): 90 mg via ORAL
  Filled 2019-08-24 (×2): qty 1

## 2019-08-24 MED ORDER — HEPARIN SODIUM (PORCINE) 1000 UNIT/ML IJ SOLN
INTRAMUSCULAR | Status: DC | PRN
  Administered 2019-08-24: 2000 [IU] via INTRAVENOUS
  Administered 2019-08-24: 3000 [IU] via INTRAVENOUS
  Administered 2019-08-24: 4500 [IU] via INTRAVENOUS

## 2019-08-24 MED ORDER — MIDAZOLAM HCL 2 MG/2ML IJ SOLN
INTRAMUSCULAR | Status: DC | PRN
  Administered 2019-08-24: 1 mg via INTRAVENOUS
  Administered 2019-08-24: 2 mg via INTRAVENOUS

## 2019-08-24 MED ORDER — HEPARIN (PORCINE) IN NACL 1000-0.9 UT/500ML-% IV SOLN
INTRAVENOUS | Status: DC | PRN
  Administered 2019-08-24 (×2): 500 mL

## 2019-08-24 MED ORDER — HYDRALAZINE HCL 20 MG/ML IJ SOLN: 10.0000 mg | INTRAMUSCULAR | Status: AC | PRN

## 2019-08-24 MED ORDER — FENTANYL CITRATE (PF) 100 MCG/2ML IJ SOLN
INTRAMUSCULAR | Status: DC | PRN
  Administered 2019-08-24 (×2): 25 ug via INTRAVENOUS

## 2019-08-24 MED ORDER — SODIUM CHLORIDE 0.9 % WEIGHT BASED INFUSION
3.0000 mL/kg/h | INTRAVENOUS | Status: DC
  Administered 2019-08-24: 3 mL/kg/h via INTRAVENOUS

## 2019-08-24 MED ORDER — SODIUM CHLORIDE 0.9 % IV SOLN: INTRAVENOUS | Status: DC

## 2019-08-24 MED ORDER — LABETALOL HCL 5 MG/ML IV SOLN: 10.0000 mg | INTRAVENOUS | Status: AC | PRN

## 2019-08-24 MED ORDER — NITROGLYCERIN 1 MG/10 ML FOR IR/CATH LAB
INTRA_ARTERIAL | Status: AC
  Filled 2019-08-24: qty 10

## 2019-08-24 MED ORDER — DIAZEPAM 5 MG PO TABS: 5.0000 mg | ORAL_TABLET | Freq: Four times a day (QID) | ORAL | Status: DC | PRN

## 2019-08-24 MED ORDER — LIDOCAINE HCL (PF) 1 % IJ SOLN
INTRAMUSCULAR | Status: AC
  Filled 2019-08-24: qty 30

## 2019-08-24 MED ORDER — SODIUM CHLORIDE 0.9 % IV SOLN: 250.0000 mL | INTRAVENOUS | Status: DC | PRN

## 2019-08-24 MED ORDER — SODIUM CHLORIDE 0.9 % WEIGHT BASED INFUSION: 1.0000 mL/kg/h | INTRAVENOUS | Status: DC

## 2019-08-24 MED ORDER — ASPIRIN 81 MG PO CHEW: 81.0000 mg | CHEWABLE_TABLET | Freq: Every day | ORAL | Status: DC

## 2019-08-24 SURGICAL SUPPLY — 23 items
BALLN SAPPHIRE 2.0X10 (BALLOONS) ×2
BALLN SAPPHIRE 2.5X15 (BALLOONS) ×2
BALLN SAPPHIRE ~~LOC~~ 2.25X8 (BALLOONS) ×1 IMPLANT
BALLN SAPPHIRE ~~LOC~~ 2.75X18 (BALLOONS) ×1 IMPLANT
BALLOON SAPPHIRE 2.0X10 (BALLOONS) IMPLANT
BALLOON SAPPHIRE 2.5X15 (BALLOONS) IMPLANT
CATH LAUNCHER 6FR JR4 (CATHETERS) ×1 IMPLANT
CATH OPTITORQUE TIG 4.0 5F (CATHETERS) ×1 IMPLANT
CATHETER LAUNCHER 6FR JR4 SH (CATHETERS) ×1 IMPLANT
DEVICE RAD COMP TR BAND LRG (VASCULAR PRODUCTS) ×1 IMPLANT
GLIDESHEATH SLEND SS 6F .021 (SHEATH) ×1 IMPLANT
GUIDEWIRE INQWIRE 1.5J.035X260 (WIRE) IMPLANT
INQWIRE 1.5J .035X260CM (WIRE) ×2
KIT ENCORE 26 ADVANTAGE (KITS) ×1 IMPLANT
KIT HEART LEFT (KITS) ×2 IMPLANT
PACK CARDIAC CATHETERIZATION (CUSTOM PROCEDURE TRAY) ×2 IMPLANT
SHEATH PROBE COVER 6X72 (BAG) ×1 IMPLANT
STENT RESOLUTE ONYX 2.0X12 (Permanent Stent) ×1 IMPLANT
STENT RESOLUTE ONYX 2.5X30 (Permanent Stent) ×1 IMPLANT
TRANSDUCER W/STOPCOCK (MISCELLANEOUS) ×2 IMPLANT
TUBING CIL FLEX 10 FLL-RA (TUBING) ×2 IMPLANT
WIRE COUGAR XT STRL 190CM (WIRE) ×1 IMPLANT
WIRE HI TORQ VERSACORE-J 145CM (WIRE) ×1 IMPLANT

## 2019-08-24 NOTE — Progress Notes (Signed)
PROGRESS NOTE  Jill Paoatricia A Perry AVW:[id number] DOB: 08/14/1955 DOA: 08/23/2019 PCP: Patient, No Pcp Per   LOS: 1 day   Brief narrative:  As per HPI,  Jill Perry is a 64 y.o. female with no significant past medical history who presented to the ER with chest pain.  Patient reported 3 hours of central chest pain which was reported as burning with radiation to the left shoulder as well as to her jaw.  No associated shortness of breath. ED course: Vital Signs reviewed on presentation, significant for temperature 97.7, heart rate 68, blood pressure 111/51, saturation 97% on room air. Labs reviewed, significant for sodium 139, potassium 3.6, BUN 15, creatinine 0.6, first troponin 38, repeat 275.  LFTs within normal limits, lipase 28.  WBC count 9.1, hemoglobin 13.5, hematocrit 40, platelets 208, INR 1.0.  SARS Covid RT-PCR is negative. Chest x-ray shows no acute cardiac disease or pulmonary infiltrates.  Right aortic arch is suspected.  Slight anterior deviation of the trachea on the lateral view which may represent aberrant subclavian artery. EKG personally reviewed, shows sinus rhythm, no acute ST-T changes.  Assessment/Plan:  Active Problems:   NSTEMI (non-ST elevated myocardial infarction) ([facility])   Acute NSTEMI:  Patient is a 1/2 pack a day smoker with a 15-pack-year smoking history.  EKG was nonischemic.    Significantly elevated troponin.  Patient was started on heparin drip, aspirin,and statin. [facility].  Patient underwent cardiac catheterization on 08/24/2019.   Has been started on brillianta.  No report available yet.  Cigarette smoker.  Patient was counseled about it.  VTE Prophylaxis: heparin d  Code Status:  Full  Family Communication: None  Disposition Plan: Home when ok with cardiology.  Consultants:  Cardiology  Procedures:  Cardiac catheterization   08/24/19  Antibiotics: . None  Anti-infectives (From admission, onward)   None     Subjective: Today, she denies any chest pain, palpitation, dizziness lightheadedness or shortness of breath.  Seen prior to cardiac catheterization  Objective: Vitals:   08/24/19 1437 08/24/19 1452  BP: (!) 109/52 (!) 116/51  Pulse: (!) 53 (!) 53  Resp:    Temp:    SpO2: 100% 100%    Intake/Output Summary (Last 24 hours) at 08/24/2019 1512 Last data filed at 08/24/2019 0909 Gross per 24 hour  Intake 159.97 ml  Output --  Net 159.97 ml   Filed Weights   08/23/19 0053 08/24/19 0546  Weight: 62.1 kg 59.1 kg   Body mass index is 20.41 kg/m.   Physical Exam: GENERAL: Patient is alert awake and oriented. Not in obvious distress. HENT: No scleral pallor or icterus. Pupils equally reactive to light. Oral mucosa is moist NECK: is supple,  CHEST: Clear to auscultation. No crackles or wheezes.  Diminished breath sounds bilaterally. CVS: S1 and S2 heard, no murmur. Regular rate and rhythm.  ABDOMEN: Soft, non-tender, bowel sounds are present. EXTREMITIES: No edema. CNS: Cranial nerves are intact. No focal motor deficits. SKIN: warm and dry without rashes.  Data Review: I have personally reviewed the following laboratory data and studies,  CBC: Recent Labs  Lab 08/23/19 0100 08/24/19 0719  WBC 9.1 7.4  HGB 13.5 13.8  HCT 40.6 41.7  MCV 94.6 95.4  PLT 208 203   Basic Metabolic Panel: Recent Labs  Lab 08/23/19 0100 08/24/19 0719  NA 139 145  K 3.6 4.0  CL 105 110  CO2 27 24  GLUCOSE 129* 98  BUN 15 11  CREATININE 0.66 0.73  CALCIUM 9.5 9.2   Liver Function Tests: Recent Labs  Lab 08/23/19 0100  AST 21  ALT 22  ALKPHOS 63  BILITOT 0.5  PROT 6.9  ALBUMIN 3.9   Recent Labs  Lab 08/23/19 0100  LIPASE 28   No results for input(s): AMMONIA in the last 168 hours. Cardiac Enzymes: No results for input(s): CKTOTAL, CKMB, CKMBINDEX, TROPONINI in the last 168 hours. BNP  (last 3 results) No results for input(s): BNP in the last 8760 hours.  ProBNP (last 3 results) No results for input(s): PROBNP in the last 8760 hours.  CBG: No results for input(s): GLUCAP in the last 168 hours. Recent Results (from the past 240 hour(s))  Respiratory Panel by RT PCR (Flu A&B, Covid) - Nasopharyngeal Swab     Status: None   Collection Time: 08/23/19  2:14 AM   Specimen: Nasopharyngeal Swab  Result Value Ref Range Status   SARS Coronavirus 2 by RT PCR NEGATIVE NEGATIVE Final    Comment: (NOTE) SARS-CoV-2 target nucleic acids are NOT DETECTED. The SARS-CoV-2 RNA is generally detectable in upper respiratoy specimens during the acute phase of infection. The lowest concentration of SARS-CoV-2 viral copies this assay can detect is 131 copies/mL. A negative result does not preclude SARS-Cov-2 infection and should not be used as the sole basis for treatment or other patient management decisions. A negative result may occur with  improper specimen collection/handling, submission of specimen other than nasopharyngeal swab, presence of viral mutation(s) within the areas targeted by this assay, and inadequate number of viral copies (<131 copies/mL). A negative result must be combined with clinical observations, patient history, and epidemiological information. The expected result is Negative. Fact Sheet for Patients:  PinkCheek.be Fact Sheet for Healthcare Providers:  GravelBags.it This test is not yet ap proved or cleared by the [place] FDA and  has been authorized for detection and/or diagnosis of SARS-CoV-2 by FDA under an Emergency Use Authorization (EUA). This EUA will remain  in effect (meaning this test can be used) for the duration of the COVID-19 declaration under Section 564(b)(1) of the Act, 21 U.S.C. section 360bbb-3(b)(1), unless the authorization is terminated or revoked sooner.    Influenza A by  PCR NEGATIVE NEGATIVE Final   Influenza B by PCR NEGATIVE NEGATIVE Final    Comment: (NOTE) The Xpert Xpress SARS-CoV-2/FLU/RSV assay is intended as an aid in  the diagnosis of influenza from Nasopharyngeal swab specimens and  should not be used as a sole basis for treatment. Nasal washings and  aspirates are unacceptable for Xpert Xpress SARS-CoV-2/FLU/RSV  testing. Fact Sheet for Patients: PinkCheek.be Fact Sheet for Healthcare Providers: GravelBags.it This test is not yet approved or cleared by the [place] FDA and  has been authorized for detection and/or diagnosis of SARS-CoV-2 by  FDA under an Emergency Use Authorization (EUA). This EUA will remain  in effect (meaning this test can be used) for the duration of the  Covid-19 declaration under Section 564(b)(1) of the Act, 21  U.S.C. section 360bbb-3(b)(1), unless the authorization is  terminated or revoked. Performed at [facility], [address] [place], [place] [address]      Studies: DG Chest 2 View  Result Date: 08/23/2019 CLINICAL DATA:  Poor one-view. Assess aortic arch. Rotated AP view earlier today. EXAM: CHEST - 2 VIEW COMPARISON:  Portable AP view earlier this day. FINDINGS: Decreased rotation from prior exam. Right aortic arch is suspected. Slight anterior deviation of  the trachea on the lateral view which may represent aberrant subclavian artery. There is aortic atherosclerosis. Heart is normal in size. Mild biapical pleuroparenchymal scarring again seen. No focal airspace disease, pleural effusion, or pneumothorax. No acute osseous abnormalities are seen. There is degenerative change in the spine. IMPRESSION: Decreased rotation from exam earlier today. Right aortic arch is suspected. Slight anterior deviation of the trachea on the lateral view which may represent aberrant subclavian artery. Findings could be confirmed with chest CTA. No acute pulmonary  process. Electronically Signed   By: Narda Rutherford M.D.   On: 08/23/2019 03:08   CT Angio Chest PE W and/or Wo Contrast  Result Date: 08/23/2019 CLINICAL DATA:  Patient with chest pain. EXAM: CT ANGIOGRAPHY CHEST WITH CONTRAST TECHNIQUE: Multidetector CT imaging of the chest was performed using the standard protocol during bolus administration of intravenous contrast. Multiplanar CT image reconstructions and MIPs were obtained to evaluate the vascular anatomy. CONTRAST:  OMNIPAQUE IOHEXOL 350 MG/ML SOLN COMPARISON:  None. FINDINGS: Cardiovascular: Normal heart size. No pericardial effusion. Right-sided aortic arch is demonstrated. Aberrant left subclavian artery is demonstrated coursing off the aortic arch posterior to the trachea and esophagus. There is a focal dilatation of the proximal subclavian artery at the origin from the aortic arch measuring 1.5 cm (image 29; series 4) most compatible with a Kommerell diverticula. There is peripheral atherosclerosis involving the diverticula. The immediate adjacent proximal aspect of the left subclavian artery is stenosed and markedly narrowed. Adequate opacification of the pulmonary arterial system. No intraluminal filling defects identified to suggest acute pulmonary embolus. Mediastinum/Nodes: No enlarged axillary, mediastinal or hilar lymphadenopathy. Small hiatal hernia. Lungs/Pleura: Central airways are patent. Biapical pleuroparenchymal thickening and scarring. No large area pulmonary consolidation. No pleural effusion or pneumothorax. Upper Abdomen: No acute process. Musculoskeletal: Thoracic spine degenerative changes. No aggressive or acute appearing osseous lesions. Review of the MIP images confirms the above findings. IMPRESSION: 1. No evidence for acute pulmonary embolus. 2. 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 There is a focal dilatation of the proximal  aspect of the left subclavian artery at the origin of the aortic arch measuring up to 1.5 cm (Kommerell diverticulum). Consider outpatient vascular/thoracic surgical follow-up. Additionally, follow-up CT chest could be performed in 3 months to assess for stability. 3. Aortic Atherosclerosis (ICD10-I70.0). Electronically Signed   By: Annia Belt M.D.   On: 08/23/2019 05:08   DG Chest Portable 1 View  Result Date: 08/23/2019 CLINICAL DATA:  Chest pain. EXAM: PORTABLE CHEST 1 VIEW COMPARISON:  None. FINDINGS: Patient is rotated. Heart is normal in size. Aortic arch is not well delineated, difficult to exclude right-sided arch. No pulmonary edema or focal airspace disease. Mild biapical pleuroparenchymal scarring. No pleural effusion or pneumothorax. Surgical hardware in the lower cervical spine is partially included. IMPRESSION: 1. Rotated exam. Heart is normal in size, however upper mediastinal contours are difficult to delineate due to rotation, difficult to exclude right-sided aortic arch. Recommend follow-up PA and lateral views. 2.  No acute pulmonary process. Electronically Signed   By: Narda Rutherford M.D.   On: 08/23/2019 02:06   ECHOCARDIOGRAM COMPLETE  Result Date: 08/23/2019   ECHOCARDIOGRAM REPORT   Patient Name:   Jill Perry Date of Exam: 08/23/2019 Medical Rec #:  [id number]          Height:       67.0 in Accession #:    [id number]  Weight:       137.0 lb Date of Birth:  Dec 15, 1955          BSA:          1.72 m Patient Age:    63 years           BP:           109/64 mmHg Patient Gender: F                  HR:           50 bpm. [name] Procedure: 2D Echo, Color Doppler and Cardiac Doppler Indications:    NSTEMI  History:        Patient has no prior history of Echocardiogram examinations.                 Risk Factors:Current Smoker.  Sonographer:    Irving Burton Senior RDCS Referring Phys: [id number] Lillie Columbia M GADHIA IMPRESSIONS  1. Left ventricular ejection fraction, by  visual estimation, is 60 to 65%. The left ventricle has normal function. There is no left ventricular hypertrophy.  2. The left ventricle has no regional wall motion abnormalities.  3. Global right ventricle has normal systolic function.The right ventricular size is normal. No increase in right ventricular wall thickness.  4. Left atrial size was normal.  5. Right atrial size was normal.  6. The mitral valve is normal in structure. No evidence of mitral valve regurgitation. No evidence of mitral stenosis.  7. The tricuspid valve is normal in structure.  8. The tricuspid valve is normal in structure. Tricuspid valve regurgitation is not demonstrated.  9. The aortic valve is tricuspid. Aortic valve regurgitation is not visualized. No evidence of aortic valve sclerosis or stenosis. 10. The pulmonic valve was not well visualized. Pulmonic valve regurgitation is not visualized. 11. The inferior vena cava is normal in size with greater than 50% respiratory variability, suggesting right atrial pressure of 3 mmHg. FINDINGS  Left Ventricle: Left ventricular ejection fraction, by visual estimation, is 60 to 65%. The left ventricle has normal function. The left ventricle has no regional wall motion abnormalities. There is no left ventricular hypertrophy. Right Ventricle: The right ventricular size is normal. No increase in right ventricular wall thickness. Global RV systolic function is has normal systolic function. Left Atrium: Left atrial size was normal in size. Right Atrium: Right atrial size was normal in size Pericardium: There is no evidence of pericardial effusion. Mitral Valve: The mitral valve is normal in structure. No evidence of mitral valve regurgitation. No evidence of mitral valve stenosis by observation. Tricuspid Valve: The tricuspid valve is normal in structure. Tricuspid valve regurgitation is not demonstrated. Aortic Valve: The aortic valve is tricuspid. Aortic valve regurgitation is not visualized. The  aortic valve is structurally normal, with no evidence of sclerosis or stenosis. Aortic valve mean gradient measures 3.3 mmHg. Aortic valve peak gradient measures 6.5 mmHg. Aortic valve area, by VTI measures 2.34 cm. Pulmonic Valve: The pulmonic valve was not well visualized. Pulmonic valve regurgitation is not visualized. Pulmonic regurgitation is not visualized. No evidence of pulmonic stenosis. Aorta: The aortic root is normal in size and structure. Pulmonary Artery: Indeterminant PASP, inadequate TR jet. Venous: The inferior vena cava is normal in size with greater than 50% respiratory variability, suggesting right atrial pressure of 3 mmHg. IAS/Shunts: No atrial level shunt detected by color flow Doppler.  LEFT VENTRICLE PLAX 2D LVIDd:  4.86 cm  Diastology LVIDs:         4.22 cm  LV e' lateral:   10.90 cm/s LV PW:         0.67 cm  LV E/e' lateral: 8.1 LV IVS:        0.61 cm  LV e' medial:    7.40 cm/s LVOT diam:     1.90 cm  LV E/e' medial:  11.9 LV SV:         31 ml LV SV Index:   18.22 LVOT Area:     2.84 cm  RIGHT VENTRICLE RV S prime:     10.70 cm/s TAPSE (M-mode): 2.0 cm LEFT ATRIUM             Index       RIGHT ATRIUM           Index LA diam:        3.00 cm 1.74 cm/m  RA Area:     16.70 cm LA Vol (A2C):   40.3 ml 23.40 ml/m RA Volume:   47.30 ml  27.47 ml/m LA Vol (A4C):   22.6 ml 13.13 ml/m LA Biplane Vol: 32.1 ml 18.64 ml/m  AORTIC VALVE AV Area (Vmax):    2.19 cm AV Area (Vmean):   2.19 cm AV Area (VTI):     2.34 cm AV Vmax:           127.02 cm/s AV Vmean:          86.015 cm/s AV VTI:            0.300 m AV Peak Grad:      6.5 mmHg AV Mean Grad:      3.3 mmHg LVOT Vmax:         98.30 cm/s LVOT Vmean:        66.500 cm/s LVOT VTI:          0.247 m LVOT/AV VTI ratio: 0.82  AORTA Ao Root diam: 2.30 cm MITRAL VALVE MV Area (PHT): 3.37 cm              SHUNTS MV PHT:        65.25 msec            Systemic VTI:  0.25 m MV Decel Time: 225 msec              Systemic Diam: 1.90 cm MV E velocity:  87.80 cm/s  103 cm/s MV A velocity: 101.00 cm/s 70.3 cm/s MV E/A ratio:  0.87        1.5  Dina Rich MD Electronically signed by Dina Rich MD Signature Date/Time: 08/23/2019/12:34:25 PM    Final       Joycelyn Das, MD  [facility] 08/24/2019

## 2019-08-24 NOTE — Progress Notes (Addendum)
Progress Note  Patient Name: Jill Perry Date of Encounter: 08/24/2019  Primary Cardiologist: No primary care provider on file.   Subjective   No chest pain overnight.   Inpatient Medications    Scheduled Meds: . aspirin EC  81 mg Oral Daily  . atorvastatin  80 mg Oral q1800  . metoprolol tartrate  12.5 mg Oral BID   Continuous Infusions: . heparin 750 Units/hr (08/24/19 0644)   PRN Meds: acetaminophen, nitroGLYCERIN, ondansetron (ZOFRAN) IV   Vital Signs    Vitals:   08/23/19 1545 08/23/19 1648 08/23/19 2046 08/24/19 0546  BP: 110/60 (!) 115/102 (!) 102/48 120/64  Pulse: 69 (!) 56 (!) 59 (!) 52  Resp: 14  16 20   Temp: 98 F (36.7 C) 98.3 F (36.8 C) 98.2 F (36.8 C) 97.9 F (36.6 C)  TempSrc:  Oral Oral Oral  SpO2: 99% 97% 96% 98%  Weight:    (P) 59.1 kg  Height:        Intake/Output Summary (Last 24 hours) at 08/24/2019 0746 Last data filed at 08/23/2019 1700 Gross per 24 hour  Intake 81.28 ml  Output --  Net 81.28 ml   Last 3 Weights 08/24/2019 08/23/2019  Weight (lbs) 130 lb 4.8 oz 137 lb  Weight (kg) 59.104 kg 62.143 kg      Telemetry    SB - Personally Reviewed  ECG    SB without ST/TW changes - Personally Reviewed  Physical Exam  Pleasant older WF, sitting on the side of the bed. GEN: No acute distress.   Neck: No JVD Cardiac: RRR, no murmurs, rubs, or gallops.  Respiratory: Clear to auscultation bilaterally. GI: Soft, nontender, non-distended  MS: No edema; No deformity. Neuro:  Nonfocal  Psych: Normal affect   Labs    High Sensitivity Troponin:   Recent Labs  Lab 08/23/19 0100 08/23/19 0257 08/23/19 0916  TROPONINIHS 38* 275* 1,309*      Chemistry Recent Labs  Lab 08/23/19 0100  NA 139  K 3.6  CL 105  CO2 27  GLUCOSE 129*  BUN 15  CREATININE 0.66  CALCIUM 9.5  PROT 6.9  ALBUMIN 3.9  AST 21  ALT 22  ALKPHOS 63  BILITOT 0.5  GFRNONAA >60  GFRAA >60  ANIONGAP 7     Hematology Recent Labs  Lab  08/23/19 0100  WBC 9.1  RBC 4.29  HGB 13.5  HCT 40.6  MCV 94.6  MCH 31.5  MCHC 33.3  RDW 12.6  PLT 208    BNPNo results for input(s): BNP, PROBNP in the last 168 hours.   DDimer No results for input(s): DDIMER in the last 168 hours.   Radiology    DG Chest 2 View  Result Date: 08/23/2019 CLINICAL DATA:  Poor one-view. Assess aortic arch. Rotated AP view earlier today. EXAM: CHEST - 2 VIEW COMPARISON:  Portable AP view earlier this day. FINDINGS: Decreased rotation from prior exam. Right aortic arch is suspected. Slight anterior deviation of the trachea on the lateral view which may represent aberrant subclavian artery. There is aortic atherosclerosis. Heart is normal in size. Mild biapical pleuroparenchymal scarring again seen. No focal airspace disease, pleural effusion, or pneumothorax. No acute osseous abnormalities are seen. There is degenerative change in the spine. IMPRESSION: Decreased rotation from exam earlier today. Right aortic arch is suspected. Slight anterior deviation of the trachea on the lateral view which may represent aberrant subclavian artery. Findings could be confirmed with chest CTA. No acute pulmonary process. Electronically  Signed   By: Melanie  Sanford M.D.   On: 08/23/2019 03:08   CT Angio Chest PE W and/or Wo Contrast  Result Date: 08/23/2019 CLINICAL DATA:  Patient with chest pain. EXAM: CT ANGIOGRAPHY CHEST WITH CONTRAST TECHNIQUE: Multidetector CT imaging of the chest was performed using the standard protocol during bolus administration of intravenous contrast. Multiplanar CT image reconstructions and MIPs were obtained to evaluate the vascular anatomy. CONTRAST:  100mL OMNIPAQUE IOHEXOL 350 MG/ML SOLN COMPARISON:  None. FINDINGS: Cardiovascular: Normal heart size. No pericardial effusion. Right-sided aortic arch is demonstrated. Aberrant left subclavian artery is demonstrated coursing off the aortic arch posterior to the trachea and esophagus. There is a  focal dilatation of the proximal subclavian artery at the origin from the aortic arch measuring 1.5 cm (image 29; series 4) most compatible with a Kommerell diverticula. There is peripheral atherosclerosis involving the diverticula. The immediate adjacent proximal aspect of the left subclavian artery is stenosed and markedly narrowed. Adequate opacification of the pulmonary arterial system. No intraluminal filling defects identified to suggest acute pulmonary embolus. Mediastinum/Nodes: No enlarged axillary, mediastinal or hilar lymphadenopathy. Small hiatal hernia. Lungs/Pleura: Central airways are patent. Biapical pleuroparenchymal thickening and scarring. No large area pulmonary consolidation. No pleural effusion or pneumothorax. Upper Abdomen: No acute process. Musculoskeletal: Thoracic spine degenerative changes. No aggressive or acute appearing osseous lesions. Review of the MIP images confirms the above findings. IMPRESSION: 1. No evidence for acute pulmonary embolus. 2. 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 There is a focal dilatation of the proximal aspect of the left subclavian artery at the origin of the aortic arch measuring up to 1.5 cm (Kommerell diverticulum). Consider outpatient vascular/thoracic surgical follow-up. Additionally, follow-up CT chest could be performed in 3 months to assess for stability. 3. Aortic Atherosclerosis (ICD10-I70.0). Electronically Signed   By: Drew  Davis M.D.   On: 08/23/2019 05:08   DG Chest Portable 1 View  Result Date: 08/23/2019 CLINICAL DATA:  Chest pain. EXAM: PORTABLE CHEST 1 VIEW COMPARISON:  None. FINDINGS: Patient is rotated. Heart is normal in size. Aortic arch is not well delineated, difficult to exclude right-sided arch. No pulmonary edema or focal airspace disease. Mild biapical pleuroparenchymal scarring. No pleural effusion or pneumothorax. Surgical hardware  in the lower cervical spine is partially included. IMPRESSION: 1. Rotated exam. Heart is normal in size, however upper mediastinal contours are difficult to delineate due to rotation, difficult to exclude right-sided aortic arch. Recommend follow-up PA and lateral views. 2.  No acute pulmonary process. Electronically Signed   By: Melanie  Sanford M.D.   On: 08/23/2019 02:06   ECHOCARDIOGRAM COMPLETE  Result Date: 08/23/2019   ECHOCARDIOGRAM REPORT   Patient Name:   Trenee A Patti Date of Exam: 08/23/2019 Medical Rec #:  [id number]          Height:       67.0 in Accession #:    [id number]         Weight:       137.0 lb Date of Birth:  09/03/1955          BSA:          1.72 m Patient Age:    63 years           BP:           109/64 mmHg Patient Gender: F                    HR:           50 bpm. [name] Procedure: 2D Echo, Color Doppler and Cardiac Doppler Indications:    NSTEMI  History:        Patient has no prior history of Echocardiogram examinations.                 Risk Factors:Current Smoker.  Sonographer:    Irving Burton Senior RDCS Referring Phys: [id number] Lillie Columbia M GADHIA IMPRESSIONS  1. Left ventricular ejection fraction, by visual estimation, is 60 to 65%. The left ventricle has normal function. There is no left ventricular hypertrophy.  2. The left ventricle has no regional wall motion abnormalities.  3. Global right ventricle has normal systolic function.The right ventricular size is normal. No increase in right ventricular wall thickness.  4. Left atrial size was normal.  5. Right atrial size was normal.  6. The mitral valve is normal in structure. No evidence of mitral valve regurgitation. No evidence of mitral stenosis.  7. The tricuspid valve is normal in structure.  8. The tricuspid valve is normal in structure. Tricuspid valve regurgitation is not demonstrated.  9. The aortic valve is tricuspid. Aortic valve regurgitation is not visualized. No evidence of aortic valve sclerosis or  stenosis. 10. The pulmonic valve was not well visualized. Pulmonic valve regurgitation is not visualized. 11. The inferior vena cava is normal in size with greater than 50% respiratory variability, suggesting right atrial pressure of 3 mmHg. FINDINGS  Left Ventricle: Left ventricular ejection fraction, by visual estimation, is 60 to 65%. The left ventricle has normal function. The left ventricle has no regional wall motion abnormalities. There is no left ventricular hypertrophy. Right Ventricle: The right ventricular size is normal. No increase in right ventricular wall thickness. Global RV systolic function is has normal systolic function. Left Atrium: Left atrial size was normal in size. Right Atrium: Right atrial size was normal in size Pericardium: There is no evidence of pericardial effusion. Mitral Valve: The mitral valve is normal in structure. No evidence of mitral valve regurgitation. No evidence of mitral valve stenosis by observation. Tricuspid Valve: The tricuspid valve is normal in structure. Tricuspid valve regurgitation is not demonstrated. Aortic Valve: The aortic valve is tricuspid. Aortic valve regurgitation is not visualized. The aortic valve is structurally normal, with no evidence of sclerosis or stenosis. Aortic valve mean gradient measures 3.3 mmHg. Aortic valve peak gradient measures 6.5 mmHg. Aortic valve area, by VTI measures 2.34 cm. Pulmonic Valve: The pulmonic valve was not well visualized. Pulmonic valve regurgitation is not visualized. Pulmonic regurgitation is not visualized. No evidence of pulmonic stenosis. Aorta: The aortic root is normal in size and structure. Pulmonary Artery: Indeterminant PASP, inadequate TR jet. Venous: The inferior vena cava is normal in size with greater than 50% respiratory variability, suggesting right atrial pressure of 3 mmHg. IAS/Shunts: No atrial level shunt detected by color flow Doppler.  LEFT VENTRICLE PLAX 2D LVIDd:         4.86 cm  Diastology  LVIDs:         4.22 cm  LV e' lateral:   10.90 cm/s LV PW:         0.67 cm  LV E/e' lateral: 8.1 LV IVS:        0.61 cm  LV e' medial:    7.40 cm/s LVOT diam:     1.90 cm  LV E/e' medial:  11.9 LV SV:  31 ml LV SV Index:   18.22 LVOT Area:     2.84 cm  RIGHT VENTRICLE RV S prime:     10.70 cm/s TAPSE (M-mode): 2.0 cm LEFT ATRIUM             Index       RIGHT ATRIUM           Index LA diam:        3.00 cm 1.74 cm/m  RA Area:     16.70 cm LA Vol (A2C):   40.3 ml 23.40 ml/m RA Volume:   47.30 ml  27.47 ml/m LA Vol (A4C):   22.6 ml 13.13 ml/m LA Biplane Vol: 32.1 ml 18.64 ml/m  AORTIC VALVE AV Area (Vmax):    2.19 cm AV Area (Vmean):   2.19 cm AV Area (VTI):     2.34 cm AV Vmax:           127.02 cm/s AV Vmean:          86.015 cm/s AV VTI:            0.300 m AV Peak Grad:      6.5 mmHg AV Mean Grad:      3.3 mmHg LVOT Vmax:         98.30 cm/s LVOT Vmean:        66.500 cm/s LVOT VTI:          0.247 m LVOT/AV VTI ratio: 0.82  AORTA Ao Root diam: 2.30 cm MITRAL VALVE MV Area (PHT): 3.37 cm              SHUNTS MV PHT:        65.25 msec            Systemic VTI:  0.25 m MV Decel Time: 225 msec              Systemic Diam: 1.90 cm MV E velocity: 87.80 cm/s  103 cm/s MV A velocity: 101.00 cm/s 70.3 cm/s MV E/A ratio:  0.87        1.5  Carlyle Dolly MD Electronically signed by Carlyle Dolly MD Signature Date/Time: 08/23/2019/12:34:25 PM    Final     Cardiac Studies   TTE: 08/23/19  IMPRESSIONS  1. Left ventricular ejection fraction, by visual estimation, is 60 to  65%. The left ventricle has normal function. There is no left ventricular  hypertrophy.  2. The left ventricle has no regional wall motion abnormalities.  3. Global right ventricle has normal systolic function.The right  ventricular size is normal. No increase in right ventricular wall  thickness.  4. Left atrial size was normal.  5. Right atrial size was normal.  6. The mitral valve is normal in structure. No evidence of mitral  valve  regurgitation. No evidence of mitral stenosis.  7. The tricuspid valve is normal in structure.  8. The tricuspid valve is normal in structure. Tricuspid valve  regurgitation is not demonstrated.  9. The aortic valve is tricuspid. Aortic valve regurgitation is not  visualized. No evidence of aortic valve sclerosis or stenosis.  10. The pulmonic valve was not well visualized. Pulmonic valve  regurgitation is not visualized.  11. The inferior vena cava is normal in size with greater than 50%  respiratory variability, suggesting right atrial pressure of 3 mmHg.   Patient Profile     64 y.o. female with no significant PMH who presented with chest pain to [facility] and found to have a NSTEMI. Cardiology called  for consult. Strong family hx of CAD with family having angioplasty, and brother with stents several   Assessment & Plan    1. NSTEMI: presented to [facility] with several hours of chest pain on 1/31 described as a burning sensation with radiation into her throat and left shoulder. EKG showed SB with no acute ST/TW changes. hsTn peaking at 1309. Discussed indication for cardiac cath. -- The patient understands that risks included but are not limited to stroke (1 in 1000), death (1 in 1000), kidney failure [usually temporary] (1 in 500), bleeding (1 in 200), allergic reaction [possibly serious] (1 in 200).   2. Tobacco use: cessation advised.   3. HL?: was told by her PCP that her cholesterol was not great but she wanted to try diet first. Will check lipids this morning. -- statin started on admission     Signed, Laverda Page, NP  08/24/2019, 7:46 AM    ATTENDING ATTESTATION  I have seen, examined and evaluated the patient this AM along with Laverda Page, NP-C.  After reviewing all the available data and chart, we discussed the patients laboratory, study & physical findings as well as symptoms in detail. I agree with her findings, examination as well as impression recommendations as  per our discussion.    Very pleasant woman - looks younger than stated age with strong FH of CAD transferred o/n from [facility] with NSTEMI.  Classic anginal Sx lasting ~3 hr leading to hospitalization.  Hs Troponin c/w NSTEMI - EKG unrevealing.  Agree with plan for Cath-PCI today   with NSTEMI - will need high dose high intensity statin if CAD found on Cath  For now - no BB due to bradycardia.  Consider ARB v.s amlodipine prior to d/c dependent upon BP in AM.      Bryan Lemma, M.D., M.S. Interventional Cardiologist   Pager # [phone] Phone # [phone] [address] [place], [place] [address]     For questions or updates, please contact [facility] Please consult [email or web address] for contact info under

## 2019-08-24 NOTE — Progress Notes (Signed)
Cath lab notified that they are sending for patient.  Patient notified and heparin gtt stopped.  To go to cath lab in bed.

## 2019-08-24 NOTE — H&P (View-Only) (Signed)
Progress Note  Patient Name: Jill Perry Date of Encounter: 08/24/2019  Primary Cardiologist: No primary care provider on file.   Subjective   No chest pain overnight.   Inpatient Medications    Scheduled Meds: . aspirin EC  81 mg Oral Daily  . atorvastatin  80 mg Oral q1800  . metoprolol tartrate  12.5 mg Oral BID   Continuous Infusions: . heparin 750 Units/hr (08/24/19 0644)   PRN Meds: acetaminophen, nitroGLYCERIN, ondansetron (ZOFRAN) IV   Vital Signs    Vitals:   08/23/19 1545 08/23/19 1648 08/23/19 2046 08/24/19 0546  BP: 110/60 (!) 115/102 (!) 102/48 120/64  Pulse: 69 (!) 56 (!) 59 (!) 52  Resp: 14  16 20   Temp: 98 F (36.7 C) 98.3 F (36.8 C) 98.2 F (36.8 C) 97.9 F (36.6 C)  TempSrc:  Oral Oral Oral  SpO2: 99% 97% 96% 98%  Weight:    (P) 59.1 kg  Height:        Intake/Output Summary (Last 24 hours) at 08/24/2019 0746 Last data filed at 08/23/2019 1700 Gross per 24 hour  Intake 81.28 ml  Output --  Net 81.28 ml   Last 3 Weights 08/24/2019 08/23/2019  Weight (lbs) 130 lb 4.8 oz 137 lb  Weight (kg) 59.104 kg 62.143 kg      Telemetry    SB - Personally Reviewed  ECG    SB without ST/TW changes - Personally Reviewed  Physical Exam  Pleasant older WF, sitting on the side of the bed. GEN: No acute distress.   Neck: No JVD Cardiac: RRR, no murmurs, rubs, or gallops.  Respiratory: Clear to auscultation bilaterally. GI: Soft, nontender, non-distended  MS: No edema; No deformity. Neuro:  Nonfocal  Psych: Normal affect   Labs    High Sensitivity Troponin:   Recent Labs  Lab 08/23/19 0100 08/23/19 0257 08/23/19 0916  TROPONINIHS 38* 275* 1,309*      Chemistry Recent Labs  Lab 08/23/19 0100  NA 139  K 3.6  CL 105  CO2 27  GLUCOSE 129*  BUN 15  CREATININE 0.66  CALCIUM 9.5  PROT 6.9  ALBUMIN 3.9  AST 21  ALT 22  ALKPHOS 63  BILITOT 0.5  GFRNONAA >60  GFRAA >60  ANIONGAP 7     Hematology Recent Labs  Lab  08/23/19 0100  WBC 9.1  RBC 4.29  HGB 13.5  HCT 40.6  MCV 94.6  MCH 31.5  MCHC 33.3  RDW 12.6  PLT 208    BNPNo results for input(s): BNP, PROBNP in the last 168 hours.   DDimer No results for input(s): DDIMER in the last 168 hours.   Radiology    DG Chest 2 View  Result Date: 08/23/2019 CLINICAL DATA:  Poor one-view. Assess aortic arch. Rotated AP view earlier today. EXAM: CHEST - 2 VIEW COMPARISON:  Portable AP view earlier this day. FINDINGS: Decreased rotation from prior exam. Right aortic arch is suspected. Slight anterior deviation of the trachea on the lateral view which may represent aberrant subclavian artery. There is aortic atherosclerosis. Heart is normal in size. Mild biapical pleuroparenchymal scarring again seen. No focal airspace disease, pleural effusion, or pneumothorax. No acute osseous abnormalities are seen. There is degenerative change in the spine. IMPRESSION: Decreased rotation from exam earlier today. Right aortic arch is suspected. Slight anterior deviation of the trachea on the lateral view which may represent aberrant subclavian artery. Findings could be confirmed with chest CTA. No acute pulmonary process. Electronically  Signed   By: Narda RutherfordMelanie  Sanford M.D.   On: 08/23/2019 03:08   CT Angio Chest PE W and/or Wo Contrast  Result Date: 08/23/2019 CLINICAL DATA:  Patient with chest pain. EXAM: CT ANGIOGRAPHY CHEST WITH CONTRAST TECHNIQUE: Multidetector CT imaging of the chest was performed using the standard protocol during bolus administration of intravenous contrast. Multiplanar CT image reconstructions and MIPs were obtained to evaluate the vascular anatomy. CONTRAST:  100mL OMNIPAQUE IOHEXOL 350 MG/ML SOLN COMPARISON:  None. FINDINGS: Cardiovascular: Normal heart size. No pericardial effusion. Right-sided aortic arch is demonstrated. Aberrant left subclavian artery is demonstrated coursing off the aortic arch posterior to the trachea and esophagus. There is a  focal dilatation of the proximal subclavian artery at the origin from the aortic arch measuring 1.5 cm (image 29; series 4) most compatible with a Kommerell diverticula. There is peripheral atherosclerosis involving the diverticula. The immediate adjacent proximal aspect of the left subclavian artery is stenosed and markedly narrowed. Adequate opacification of the pulmonary arterial system. No intraluminal filling defects identified to suggest acute pulmonary embolus. Mediastinum/Nodes: No enlarged axillary, mediastinal or hilar lymphadenopathy. Small hiatal hernia. Lungs/Pleura: Central airways are patent. Biapical pleuroparenchymal thickening and scarring. No large area pulmonary consolidation. No pleural effusion or pneumothorax. Upper Abdomen: No acute process. Musculoskeletal: Thoracic spine degenerative changes. No aggressive or acute appearing osseous lesions. Review of the MIP images confirms the above findings. IMPRESSION: 1. No evidence for acute pulmonary embolus. 2. 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 There is a focal dilatation of the proximal aspect of the left subclavian artery at the origin of the aortic arch measuring up to 1.5 cm (Kommerell diverticulum). Consider outpatient vascular/thoracic surgical follow-up. Additionally, follow-up CT chest could be performed in 3 months to assess for stability. 3. Aortic Atherosclerosis (ICD10-I70.0). Electronically Signed   By: Annia Beltrew  Davis M.D.   On: 08/23/2019 05:08   DG Chest Portable 1 View  Result Date: 08/23/2019 CLINICAL DATA:  Chest pain. EXAM: PORTABLE CHEST 1 VIEW COMPARISON:  None. FINDINGS: Patient is rotated. Heart is normal in size. Aortic arch is not well delineated, difficult to exclude right-sided arch. No pulmonary edema or focal airspace disease. Mild biapical pleuroparenchymal scarring. No pleural effusion or pneumothorax. Surgical hardware  in the lower cervical spine is partially included. IMPRESSION: 1. Rotated exam. Heart is normal in size, however upper mediastinal contours are difficult to delineate due to rotation, difficult to exclude right-sided aortic arch. Recommend follow-up PA and lateral views. 2.  No acute pulmonary process. Electronically Signed   By: Narda RutherfordMelanie  Sanford M.D.   On: 08/23/2019 02:06   ECHOCARDIOGRAM COMPLETE  Result Date: 08/23/2019   ECHOCARDIOGRAM REPORT   Patient Name:   Gilford RileRICIA A Ryer Date of Exam: 08/23/2019 Medical Rec #:  [id number]          Height:       67.0 in Accession #:    [id number]         Weight:       137.0 lb Date of Birth:  05/01/1956          BSA:          1.72 m Patient Age:    63 years           BP:           109/64 mmHg Patient Gender: F  HR:           50 bpm. [name] Procedure: 2D Echo, Color Doppler and Cardiac Doppler Indications:    NSTEMI  History:        Patient has no prior history of Echocardiogram examinations.                 Risk Factors:Current Smoker.  Sonographer:    Irving Burton Senior RDCS Referring Phys: [id number] Lillie Columbia M GADHIA IMPRESSIONS  1. Left ventricular ejection fraction, by visual estimation, is 60 to 65%. The left ventricle has normal function. There is no left ventricular hypertrophy.  2. The left ventricle has no regional wall motion abnormalities.  3. Global right ventricle has normal systolic function.The right ventricular size is normal. No increase in right ventricular wall thickness.  4. Left atrial size was normal.  5. Right atrial size was normal.  6. The mitral valve is normal in structure. No evidence of mitral valve regurgitation. No evidence of mitral stenosis.  7. The tricuspid valve is normal in structure.  8. The tricuspid valve is normal in structure. Tricuspid valve regurgitation is not demonstrated.  9. The aortic valve is tricuspid. Aortic valve regurgitation is not visualized. No evidence of aortic valve sclerosis or  stenosis. 10. The pulmonic valve was not well visualized. Pulmonic valve regurgitation is not visualized. 11. The inferior vena cava is normal in size with greater than 50% respiratory variability, suggesting right atrial pressure of 3 mmHg. FINDINGS  Left Ventricle: Left ventricular ejection fraction, by visual estimation, is 60 to 65%. The left ventricle has normal function. The left ventricle has no regional wall motion abnormalities. There is no left ventricular hypertrophy. Right Ventricle: The right ventricular size is normal. No increase in right ventricular wall thickness. Global RV systolic function is has normal systolic function. Left Atrium: Left atrial size was normal in size. Right Atrium: Right atrial size was normal in size Pericardium: There is no evidence of pericardial effusion. Mitral Valve: The mitral valve is normal in structure. No evidence of mitral valve regurgitation. No evidence of mitral valve stenosis by observation. Tricuspid Valve: The tricuspid valve is normal in structure. Tricuspid valve regurgitation is not demonstrated. Aortic Valve: The aortic valve is tricuspid. Aortic valve regurgitation is not visualized. The aortic valve is structurally normal, with no evidence of sclerosis or stenosis. Aortic valve mean gradient measures 3.3 mmHg. Aortic valve peak gradient measures 6.5 mmHg. Aortic valve area, by VTI measures 2.34 cm. Pulmonic Valve: The pulmonic valve was not well visualized. Pulmonic valve regurgitation is not visualized. Pulmonic regurgitation is not visualized. No evidence of pulmonic stenosis. Aorta: The aortic root is normal in size and structure. Pulmonary Artery: Indeterminant PASP, inadequate TR jet. Venous: The inferior vena cava is normal in size with greater than 50% respiratory variability, suggesting right atrial pressure of 3 mmHg. IAS/Shunts: No atrial level shunt detected by color flow Doppler.  LEFT VENTRICLE PLAX 2D LVIDd:         4.86 cm  Diastology  LVIDs:         4.22 cm  LV e' lateral:   10.90 cm/s LV PW:         0.67 cm  LV E/e' lateral: 8.1 LV IVS:        0.61 cm  LV e' medial:    7.40 cm/s LVOT diam:     1.90 cm  LV E/e' medial:  11.9 LV SV:  31 ml LV SV Index:   18.22 LVOT Area:     2.84 cm  RIGHT VENTRICLE RV S prime:     10.70 cm/s TAPSE (M-mode): 2.0 cm LEFT ATRIUM             Index       RIGHT ATRIUM           Index LA diam:        3.00 cm 1.74 cm/m  RA Area:     16.70 cm LA Vol (A2C):   40.3 ml 23.40 ml/m RA Volume:   47.30 ml  27.47 ml/m LA Vol (A4C):   22.6 ml 13.13 ml/m LA Biplane Vol: 32.1 ml 18.64 ml/m  AORTIC VALVE AV Area (Vmax):    2.19 cm AV Area (Vmean):   2.19 cm AV Area (VTI):     2.34 cm AV Vmax:           127.02 cm/s AV Vmean:          86.015 cm/s AV VTI:            0.300 m AV Peak Grad:      6.5 mmHg AV Mean Grad:      3.3 mmHg LVOT Vmax:         98.30 cm/s LVOT Vmean:        66.500 cm/s LVOT VTI:          0.247 m LVOT/AV VTI ratio: 0.82  AORTA Ao Root diam: 2.30 cm MITRAL VALVE MV Area (PHT): 3.37 cm              SHUNTS MV PHT:        65.25 msec            Systemic VTI:  0.25 m MV Decel Time: 225 msec              Systemic Diam: 1.90 cm MV E velocity: 87.80 cm/s  103 cm/s MV A velocity: 101.00 cm/s 70.3 cm/s MV E/A ratio:  0.87        1.5  Carlyle Dolly MD Electronically signed by Carlyle Dolly MD Signature Date/Time: 08/23/2019/12:34:25 PM    Final     Cardiac Studies   TTE: 08/23/19  IMPRESSIONS  1. Left ventricular ejection fraction, by visual estimation, is 60 to  65%. The left ventricle has normal function. There is no left ventricular  hypertrophy.  2. The left ventricle has no regional wall motion abnormalities.  3. Global right ventricle has normal systolic function.The right  ventricular size is normal. No increase in right ventricular wall  thickness.  4. Left atrial size was normal.  5. Right atrial size was normal.  6. The mitral valve is normal in structure. No evidence of mitral  valve  regurgitation. No evidence of mitral stenosis.  7. The tricuspid valve is normal in structure.  8. The tricuspid valve is normal in structure. Tricuspid valve  regurgitation is not demonstrated.  9. The aortic valve is tricuspid. Aortic valve regurgitation is not  visualized. No evidence of aortic valve sclerosis or stenosis.  10. The pulmonic valve was not well visualized. Pulmonic valve  regurgitation is not visualized.  11. The inferior vena cava is normal in size with greater than 50%  respiratory variability, suggesting right atrial pressure of 3 mmHg.   Patient Profile     64 y.o. female with no significant PMH who presented with chest pain to [facility] and found to have a NSTEMI. Cardiology called  for consult. Strong family hx of CAD with family having angioplasty, and brother with stents several   Assessment & Plan    1. NSTEMI: presented to [facility] with several hours of chest pain on 1/31 described as a burning sensation with radiation into her throat and left shoulder. EKG showed SB with no acute ST/TW changes. hsTn peaking at 1309. Discussed indication for cardiac cath. -- The patient understands that risks included but are not limited to stroke (1 in 1000), death (1 in 1000), kidney failure [usually temporary] (1 in 500), bleeding (1 in 200), allergic reaction [possibly serious] (1 in 200).   2. Tobacco use: cessation advised.   3. HL?: was told by her PCP that her cholesterol was not great but she wanted to try diet first. Will check lipids this morning. -- statin started on admission     Signed, Laverda Page, NP  08/24/2019, 7:46 AM    ATTENDING ATTESTATION  I have seen, examined and evaluated the patient this AM along with Laverda Page, NP-C.  After reviewing all the available data and chart, we discussed the patients laboratory, study & physical findings as well as symptoms in detail. I agree with her findings, examination as well as impression recommendations as  per our discussion.    Very pleasant woman - looks younger than stated age with strong FH of CAD transferred o/n from [facility] with NSTEMI.  Classic anginal Sx lasting ~3 hr leading to hospitalization.  Hs Troponin c/w NSTEMI - EKG unrevealing.  Agree with plan for Cath-PCI today   with NSTEMI - will need high dose high intensity statin if CAD found on Cath  For now - no BB due to bradycardia.  Consider ARB v.s amlodipine prior to d/c dependent upon BP in AM.      Bryan Lemma, M.D., M.S. Interventional Cardiologist   Pager # [phone] Phone # [phone] [address] [place], [place] [address]     For questions or updates, please contact [facility] Please consult [email or web address] for contact info under

## 2019-08-24 NOTE — Progress Notes (Addendum)
ANTICOAGULATION CONSULT NOTE   Pharmacy Consult for heparin Indication: chest pain/ACS  No Known Allergies  Patient Measurements: Height: 5\' 7"  (170.2 cm) Weight: 130 lb 4.8 oz (59.1 kg) IBW/kg (Calculated) : 61.6 HEPARIN DW (KG): 62.1   Vital Signs: Temp: 97.9 F (36.6 C) (02/01 0546) Temp Source: Oral (02/01 0546) BP: 130/71 (02/01 0815) Pulse Rate: 48 (02/01 0815)  Labs: Recent Labs    08/23/19 0100 08/23/19 0916 08/23/19 1442 08/24/19 0719  HGB 13.5  --   --  13.8  HCT 40.6  --   --  41.7  PLT 208  --   --  203  APTT 33  --   --   --   LABPROT 12.9  --   --   --   INR 1.0  --   --   --   HEPARINUNFRC  --  0.46 0.43 0.23*  CREATININE 0.66  --   --  0.73   Estimated Creatinine Clearance: 65.4 mL/min (by C-G formula based on SCr of 0.73 mg/dL).  Medical History: History reviewed. No pertinent past medical history.  Medications:  Infusions:  . sodium chloride    . heparin 750 Units/hr (08/24/19 0645)   Assessment: Pt in ED with chest pain and mid-sternal burning that radiates to shoulders, troponin elevated.  Starting heparin gtt per pharmacy. No PTA meds, baseline labs pending, plts ok.   Heparin level low this am, no bleeding or CP noted on rounds. CBC stable.  Plan for cath today  Goal of Therapy:  Heparin level 0.3-0.7 units/ml   Plan:  Increase heparin infusion to 850 units/hr Follow up after cath  10/22/19 PharmD., BCPS Clinical Pharmacist 08/24/2019 10:10 AM

## 2019-08-24 NOTE — Interval H&P Note (Signed)
History and Physical Interval Note:  08/24/2019 11:37 AM  Jill Perry  has presented today for surgery, with the diagnosis of n stemi.  The various methods of treatment have been discussed with the patient and family. After consideration of risks, benefits and other options for treatment, the patient has consented to  Procedure(s): LEFT HEART CATH AND CORONARY ANGIOGRAPHY (N/A) as a surgical intervention.  The patient's history has been reviewed, patient examined, no change in status, stable for surgery.  I have reviewed the patient's chart and labs.  Questions were answered to the patient's satisfaction.    Cath Lab Visit (complete for each Cath Lab visit)  Clinical Evaluation Leading to the Procedure:   ACS: Yes.    Non-ACS:    Anginal Classification: CCS III  Anti-ischemic medical therapy: Minimal Therapy (1 class of medications)  Non-Invasive Test Results: No non-invasive testing performed  Prior CABG: No previous CABG       Jill Perry

## 2019-08-24 NOTE — Plan of Care (Signed)
  Problem: Clinical Measurements: Goal: Diagnostic test results will improve Outcome: Progressing   Problem: Health Behavior/Discharge Planning: Goal: Ability to manage health-related needs will improve Outcome: Completed/Met   Problem: Nutrition: Goal: Adequate nutrition will be maintained Outcome: Completed/Met   Problem: Coping: Goal: Level of anxiety will decrease Outcome: Completed/Met   Problem: Elimination: Goal: Will not experience complications related to bowel motility Outcome: Completed/Met Goal: Will not experience complications related to urinary retention Outcome: Completed/Met   Problem: Pain Managment: Goal: General experience of comfort will improve Outcome: Completed/Met

## 2019-08-25 DIAGNOSIS — Z955 Presence of coronary angioplasty implant and graft: Secondary | ICD-10-CM

## 2019-08-25 LAB — CBC
HCT: 35.9 % — ABNORMAL LOW (ref 36.0–46.0)
Hemoglobin: 11.9 g/dL — ABNORMAL LOW (ref 12.0–15.0)
MCH: 31.5 pg (ref 26.0–34.0)
MCHC: 33.1 g/dL (ref 30.0–36.0)
MCV: 95 fL (ref 80.0–100.0)
Platelets: 195 10*3/uL (ref 150–400)
RBC: 3.78 MIL/uL — ABNORMAL LOW (ref 3.87–5.11)
RDW: 12.6 % (ref 11.5–15.5)
WBC: 6.9 10*3/uL (ref 4.0–10.5)
nRBC: 0 % (ref 0.0–0.2)

## 2019-08-25 LAB — BASIC METABOLIC PANEL
Anion gap: 8 (ref 5–15)
BUN: 8 mg/dL (ref 8–23)
CO2: 23 mmol/L (ref 22–32)
Calcium: 8.6 mg/dL — ABNORMAL LOW (ref 8.9–10.3)
Chloride: 112 mmol/L — ABNORMAL HIGH (ref 98–111)
Creatinine, Ser: 0.64 mg/dL (ref 0.44–1.00)
GFR calc Af Amer: >60 mL/min (ref >60)
GFR calc non Af Amer: >60 mL/min (ref >60)
Glucose, Bld: 89 mg/dL (ref 70–99)
Potassium: 4 mmol/L (ref 3.5–5.1)
Sodium: 143 mmol/L (ref 135–145)

## 2019-08-25 MED ORDER — TICAGRELOR 90 MG PO TABS: 90.0000 mg | ORAL_TABLET | Freq: Two times a day (BID) | ORAL | 11 refills | Status: DC

## 2019-08-25 MED ORDER — METOPROLOL TARTRATE 25 MG PO TABS: 12.5000 mg | ORAL_TABLET | Freq: Two times a day (BID) | ORAL | 2 refills | Status: DC

## 2019-08-25 MED ORDER — ATORVASTATIN CALCIUM 80 MG PO TABS: 80.0000 mg | ORAL_TABLET | Freq: Every day | ORAL | 2 refills | Status: DC

## 2019-08-25 MED ORDER — ASPIRIN 81 MG PO TBEC: 81.0000 mg | DELAYED_RELEASE_TABLET | Freq: Every day | ORAL | 11 refills | Status: AC

## 2019-08-25 MED ORDER — NICOTINE 14 MG/24HR TD PT24: 14.0000 mg | MEDICATED_PATCH | TRANSDERMAL | 0 refills | Status: DC

## 2019-08-25 MED FILL — METOPROLOL TARTRATE 25 MG T: 25 | 60 days supply | Qty: 60 | Fill #0 | Status: TO

## 2019-08-25 MED FILL — BRILINTA 90 MG TABLET: 90 | 30 days supply | Qty: 60 | Fill #0 | Status: TO

## 2019-08-25 MED FILL — ATORVASTATIN CALCIUM 80 MG: 80 | 30 days supply | Qty: 30 | Fill #0 | Status: TO

## 2019-08-25 MED FILL — ASPIRIN LOW DOSE 81 MG TBEC: 81 | 30 days supply | Qty: 30 | Fill #0 | Status: TO

## 2019-08-25 NOTE — Discharge Summary (Signed)
Physician Discharge Summary  Jill Perry PNT:[id number] DOB: 1956/07/10 DOA: 08/23/2019  PCP: Patient, No Pcp Per  Admit date: 08/23/2019 Discharge date: 08/25/2019  Admitted From: Home  Discharge disposition: Home   Recommendations for Outpatient Follow-Up:   . Follow up with your primary care provider in one week.   Check CBC, BMP in the next visit  Follow-up with cardiology as outpatient after cardiac catheterization.  Cardiology office to set up an appointment.   Discharge Diagnosis:   Active Problems:   NSTEMI (non-ST elevated myocardial infarction) [facility])   Discharge Condition: Improved.  Diet recommendation: Low sodium, heart healthy.    Wound care: None.  Code status: Full.   History of Present Illness:   Jill Perry a 64 y.o.femalewithno significant past medical history who presented to the ER with chest pain. Patient reported 3 hours of central chest pain which was reported as burningwith radiationto the left shoulder as well as to her jaw. No associated shortness of breath.ED course: Vital Signs reviewed on presentation, significant fortemperature 97.7, heart rate 68, blood pressure 111/51, saturation 97% on room air. Labs reviewed, significant forsodium 139, potassium 3.6, BUN 15, creatinine 0.6, first troponin 38, repeat 275. LFTs within normal limits, lipase 28. WBC count 9.1, hemoglobin 13.5, hematocrit 40, platelets 208, INR 1.0. SARS Covid RT-PCR is negative.Chest x-ray shows no acute cardiac disease or pulmonary infiltrates. Right aortic arch is suspected. Slight anterior deviation of the trachea on the lateral view which may represent aberrant subclavian artery. EKG showedsinus rhythm, no acute ST-T changes.   Hospital Course:   Following conditions were addressed during hospitalization as listed below,  Acute NSTEMI: EKGwasnonischemic. Significantly elevated troponin. Patient was started on heparin  drip,aspirin,and statin. [facility].  2D echocardiogram done on 08/23/2019 showed preserved LV function with ejection fraction of 60 to 65%. Patient underwentcardiac catheterization on 08/24/2019 with findings of multivessel coronary artery disease. PCI/DES to [facility] and pPDA was done.  Patient was prescribed dual antiplatelets with aspirin and Brilinta and will be prescribed for at least 1 year.  Low dose beta-blockers were initiated during hospitalization by cardiology.  Patient has been started on high-dose statins as well.  Lifestyle modification has been discussed with the patient including quitting smoking.  Will need to follow-up with her primary care physician and cardiology as outpatient after discharge.  Cigarette smoker. Patient was counseled regarding quitting smoking.  Will be prescribed nicotine patch on discharge.  Disposition.  At this time, patient is stable for disposition home.  Medical Consultants:    Cardiology.  Procedures:    Cardiac catheterization with PCI/DES to dRCA and pPDA Subjective:   Today, patient feels okay.  Denies any shortness of breath, chest pain, dyspnea.  Discharge Exam:   Vitals:   08/25/19 0914 08/25/19 0917  BP: 95/60 95/60  Pulse: (!) 59 (!) 59  Resp:  18  Temp:    SpO2:     Vitals:   08/25/19 0500 08/25/19 0605 08/25/19 0914 08/25/19 0917  BP:  111/61 95/60 95/60   Pulse:  (!) 51 (!) 59 (!) 59  Resp:  17  18  Temp:  97.8 F (36.6 C)    TempSrc:  Oral    SpO2:  99%    Weight: 59.7 kg     Height:       General: Alert awake, not in obvious distress HENT: pupils equally reacting to light,  No scleral pallor or icterus noted.  Oral mucosa is moist.  Chest:  Clear breath sounds.  Diminished breath sounds bilaterally. No crackles or wheezes.  CVS: S1 &S2 heard. No murmur.  Regular rate and rhythm. Abdomen: Soft, nontender, nondistended.  Bowel sounds are  heard.   Extremities: No cyanosis, clubbing or edema.  Peripheral pulses are palpable.  Radial site with dressing. Psych: Alert, awake and oriented, normal mood CNS:  No cranial nerve deficits.  Power equal in all extremities.   Skin: Warm and dry.  No rashes noted.  The results of significant diagnostics from this hospitalization (including imaging, microbiology, ancillary and laboratory) are listed below for reference.     Diagnostic Studies:   DG Chest 2 View  Result Date: 08/23/2019 CLINICAL DATA:  Poor one-view. Assess aortic arch. Rotated AP view earlier today. EXAM: CHEST - 2 VIEW COMPARISON:  Portable AP view earlier this day. FINDINGS: Decreased rotation from prior exam. Right aortic arch is suspected. Slight anterior deviation of the trachea on the lateral view which may represent aberrant subclavian artery. There is aortic atherosclerosis. Heart is normal in size. Mild biapical pleuroparenchymal scarring again seen. No focal airspace disease, pleural effusion, or pneumothorax. No acute osseous abnormalities are seen. There is degenerative change in the spine. IMPRESSION: Decreased rotation from exam earlier today. Right aortic arch is suspected. Slight anterior deviation of the trachea on the lateral view which may represent aberrant subclavian artery. Findings could be confirmed with chest CTA. No acute pulmonary process. Electronically Signed   By: Narda Rutherford M.D.   On: 08/23/2019 03:08   CT Angio Chest PE W and/or Wo Contrast  Result Date: 08/23/2019 CLINICAL DATA:  Patient with chest pain. EXAM: CT ANGIOGRAPHY CHEST WITH CONTRAST TECHNIQUE: Multidetector CT imaging of the chest was performed using the standard protocol during bolus administration of intravenous contrast. Multiplanar CT image reconstructions and MIPs were obtained to evaluate the vascular anatomy. CONTRAST:  OMNIPAQUE IOHEXOL 350 MG/ML SOLN COMPARISON:  None. FINDINGS: Cardiovascular: Normal heart size. No  pericardial effusion. Right-sided aortic arch is demonstrated. Aberrant left subclavian artery is demonstrated coursing off the aortic arch posterior to the trachea and esophagus. There is a focal dilatation of the proximal subclavian artery at the origin from the aortic arch measuring 1.5 cm (image 29; series 4) most compatible with a Kommerell diverticula. There is peripheral atherosclerosis involving the diverticula. The immediate adjacent proximal aspect of the left subclavian artery is stenosed and markedly narrowed. Adequate opacification of the pulmonary arterial system. No intraluminal filling defects identified to suggest acute pulmonary embolus. Mediastinum/Nodes: No enlarged axillary, mediastinal or hilar lymphadenopathy. Small hiatal hernia. Lungs/Pleura: Central airways are patent. Biapical pleuroparenchymal thickening and scarring. No large area pulmonary consolidation. No pleural effusion or pneumothorax. Upper Abdomen: No acute process. Musculoskeletal: Thoracic spine degenerative changes. No aggressive or acute appearing osseous lesions. Review of the MIP images confirms the above findings. IMPRESSION: 1. No evidence for acute pulmonary embolus. 2. 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 There is a focal dilatation of the proximal aspect of the left subclavian artery at the origin of the aortic arch measuring up to 1.5 cm (Kommerell diverticulum). Consider outpatient vascular/thoracic surgical follow-up. Additionally, follow-up CT chest could be performed in 3 months to assess for stability. 3. Aortic Atherosclerosis (ICD10-I70.0). Electronically Signed   By: Annia Belt M.D.   On: 08/23/2019 05:08   CARDIAC CATHETERIZATION  Result Date: 08/24/2019  Dist RCA lesion  is 95% stenosed.  Prox RCA to Mid RCA lesion is 70% stenosed.  1st Mrg lesion is 30% stenosed.  Ost LAD to Mid LAD lesion is 35% stenosed.   3rd Diag lesion is 70% stenosed.  Mid LAD lesion is 70% stenosed.  Post intervention, there is a 0% residual stenosis.  Post intervention, there is a 0% residual stenosis.  Ost RCA to Prox RCA lesion is 20% stenosed.  A stent was successfully placed.  A stent was successfully placed.  Multi-vessel CAD with diffuse 30 to 35% stenoses in the proximal LAD prior to the first diagonal vessel with 70% focal stenosis immediately after the first diagonal vessel and diffuse 70% ostial proximal stenosis in the second diagonal vessel; 30% circumflex marginal-1 stenosis; dominant RCA with mild ostial tapering with 70% diffuse mid stenosis and 95% focal stenosis just proximal to the PDA takeoff distally. LVEDP 7 mmHg. Successful PCI to the RCA with PTCA and ultimate insertion of a 2.0 x 12 mm Resolute Onyx DES stent postdilated 2.25 mm in the distal RCA immediately proximal to the PDA takeoff and PCI/DES stenting of the mid RCA with insertion of a 2.5 x 30 mm Resolute Onyx DES stent postdilated to 2.75 mm meters with the stenosis being reduced to 0%. RECOMMENDATION: DAPT for minimum of 1 year.  Aggressive lipid-lowering therapy target LDL 60 or below.  Initial medical therapy for concomitant CAD involving the LAD and circumflex marginal vessel.  Smoking cessation is imperative.   DG Chest Portable 1 View  Result Date: 08/23/2019 CLINICAL DATA:  Chest pain. EXAM: PORTABLE CHEST 1 VIEW COMPARISON:  None. FINDINGS: Patient is rotated. Heart is normal in size. Aortic arch is not well delineated, difficult to exclude right-sided arch. No pulmonary edema or focal airspace disease. Mild biapical pleuroparenchymal scarring. No pleural effusion or pneumothorax. Surgical hardware in the lower cervical spine is partially included. IMPRESSION: 1. Rotated exam. Heart is normal in size, however upper mediastinal contours are difficult to delineate due to rotation, difficult to exclude right-sided aortic arch. Recommend follow-up  PA and lateral views. 2.  No acute pulmonary process. Electronically Signed   By: Keith Rake M.D.   On: 08/23/2019 02:06   ECHOCARDIOGRAM COMPLETE  Result Date: 08/23/2019   ECHOCARDIOGRAM REPORT   Patient Name:   Jill Perry Weins Date of Exam: 08/23/2019 Medical Rec #:  [id number]          Height:       67.0 in Accession #:    [id number]         Weight:       137.0 lb Date of Birth:  1956-01-13          BSA:          1.72 m Patient Age:    80 years           BP:           109/64 mmHg Patient Gender: F                  HR:           50 bpm. Exam Location:  [facility] Procedure: 2D Echo, Color Doppler and Cardiac Doppler Indications:    NSTEMI  History:        Patient has no prior history of Echocardiogram examinations.                 Risk Factors:Current Smoker.  Sonographer:    Raquel Sarna Senior RDCS Referring Phys:  [id number] SHARDUL M GADHIA IMPRESSIONS  1. Left ventricular ejection fraction, by visual estimation, is 60 to 65%. The left ventricle has normal function. There is no left ventricular hypertrophy.  2. The left ventricle has no regional wall motion abnormalities.  3. Global right ventricle has normal systolic function.The right ventricular size is normal. No increase in right ventricular wall thickness.  4. Left atrial size was normal.  5. Right atrial size was normal.  6. The mitral valve is normal in structure. No evidence of mitral valve regurgitation. No evidence of mitral stenosis.  7. The tricuspid valve is normal in structure.  8. The tricuspid valve is normal in structure. Tricuspid valve regurgitation is not demonstrated.  9. The aortic valve is tricuspid. Aortic valve regurgitation is not visualized. No evidence of aortic valve sclerosis or stenosis. 10. The pulmonic valve was not well visualized. Pulmonic valve regurgitation is not visualized. 11. The inferior vena cava is normal in size with greater than 50% respiratory variability, suggesting right atrial pressure of 3 mmHg. FINDINGS   Left Ventricle: Left ventricular ejection fraction, by visual estimation, is 60 to 65%. The left ventricle has normal function. The left ventricle has no regional wall motion abnormalities. There is no left ventricular hypertrophy. Right Ventricle: The right ventricular size is normal. No increase in right ventricular wall thickness. Global RV systolic function is has normal systolic function. Left Atrium: Left atrial size was normal in size. Right Atrium: Right atrial size was normal in size Pericardium: There is no evidence of pericardial effusion. Mitral Valve: The mitral valve is normal in structure. No evidence of mitral valve regurgitation. No evidence of mitral valve stenosis by observation. Tricuspid Valve: The tricuspid valve is normal in structure. Tricuspid valve regurgitation is not demonstrated. Aortic Valve: The aortic valve is tricuspid. Aortic valve regurgitation is not visualized. The aortic valve is structurally normal, with no evidence of sclerosis or stenosis. Aortic valve mean gradient measures 3.3 mmHg. Aortic valve peak gradient measures 6.5 mmHg. Aortic valve area, by VTI measures 2.34 cm. Pulmonic Valve: The pulmonic valve was not well visualized. Pulmonic valve regurgitation is not visualized. Pulmonic regurgitation is not visualized. No evidence of pulmonic stenosis. Aorta: The aortic root is normal in size and structure. Pulmonary Artery: Indeterminant PASP, inadequate TR jet. Venous: The inferior vena cava is normal in size with greater than 50% respiratory variability, suggesting right atrial pressure of 3 mmHg. IAS/Shunts: No atrial level shunt detected by color flow Doppler.  LEFT VENTRICLE PLAX 2D LVIDd:         4.86 cm  Diastology LVIDs:         4.22 cm  LV e' lateral:   10.90 cm/s LV PW:         0.67 cm  LV E/e' lateral: 8.1 LV IVS:        0.61 cm  LV e' medial:    7.40 cm/s LVOT diam:     1.90 cm  LV E/e' medial:  11.9 LV SV:         31 ml LV SV Index:   18.22 LVOT Area:     2.84  cm  RIGHT VENTRICLE RV S prime:     10.70 cm/s TAPSE (M-mode): 2.0 cm LEFT ATRIUM             Index       RIGHT ATRIUM           Index LA diam:        3.00 cm  1.74 cm/m  RA Area:     16.70 cm LA Vol (A2C):   40.3 ml 23.40 ml/m RA Volume:   47.30 ml  27.47 ml/m LA Vol (A4C):   22.6 ml 13.13 ml/m LA Biplane Vol: 32.1 ml 18.64 ml/m  AORTIC VALVE AV Area (Vmax):    2.19 cm AV Area (Vmean):   2.19 cm AV Area (VTI):     2.34 cm AV Vmax:           127.02 cm/s AV Vmean:          86.015 cm/s AV VTI:            0.300 m AV Peak Grad:      6.5 mmHg AV Mean Grad:      3.3 mmHg LVOT Vmax:         98.30 cm/s LVOT Vmean:        66.500 cm/s LVOT VTI:          0.247 m LVOT/AV VTI ratio: 0.82  AORTA Ao Root diam: 2.30 cm MITRAL VALVE MV Area (PHT): 3.37 cm              SHUNTS MV PHT:        65.25 msec            Systemic VTI:  0.25 m MV Decel Time: 225 msec              Systemic Diam: 1.90 cm MV E velocity: 87.80 cm/s  103 cm/s MV A velocity: 101.00 cm/s 70.3 cm/s MV E/A ratio:  0.87        1.5  Dina RichJonathan Branch MD Electronically signed by Dina RichJonathan Branch MD Signature Date/Time: 08/23/2019/12:34:25 PM    Final      Labs:   Basic Metabolic Panel: Recent Labs  Lab 08/23/19 0100 08/23/19 0100 08/24/19 0719 08/25/19 0310  NA 139  --  145 143  K 3.6   < > 4.0 4.0  CL 105  --  110 112*  CO2 27  --  24 23  GLUCOSE 129*  --  98 89  BUN 15  --  11 8  CREATININE 0.66  --  0.73 0.64  CALCIUM 9.5  --  9.2 8.6*   < > = values in this interval not displayed.   GFR Estimated Creatinine Clearance: 66.1 mL/min (by C-G formula based on SCr of 0.64 mg/dL). Liver Function Tests: Recent Labs  Lab 08/23/19 0100  AST 21  ALT 22  ALKPHOS 63  BILITOT 0.5  PROT 6.9  ALBUMIN 3.9   Recent Labs  Lab 08/23/19 0100  LIPASE 28   No results for input(s): AMMONIA in the last 168 hours. Coagulation profile Recent Labs  Lab 08/23/19 0100  INR 1.0    CBC: Recent Labs  Lab 08/23/19 0100 08/24/19 0719  08/25/19 0310  WBC 9.1 7.4 6.9  HGB 13.5 13.8 11.9*  HCT 40.6 41.7 35.9*  MCV 94.6 95.4 95.0  PLT 208 203 195   Cardiac Enzymes: No results for input(s): CKTOTAL, CKMB, CKMBINDEX, TROPONINI in the last 168 hours. BNP: Invalid input(s): POCBNP CBG: No results for input(s): GLUCAP in the last 168 hours. D-Dimer No results for input(s): DDIMER in the last 72 hours. Hgb A1c No results for input(s): HGBA1C in the last 72 hours. Lipid Profile Recent Labs    08/24/19 0719  CHOL 244*  HDL 55  LDLCALC 171*  TRIG 88  CHOLHDL 4.4   Thyroid function studies No results  for input(s): TSH, T4TOTAL, T3FREE, THYROIDAB in the last 72 hours.  Invalid input(s): FREET3 Anemia work up No results for input(s): VITAMINB12, FOLATE, FERRITIN, TIBC, IRON, RETICCTPCT in the last 72 hours. Microbiology Recent Results (from the past 240 hour(s))  Respiratory Panel by RT PCR (Flu A&B, Covid) - Nasopharyngeal Swab     Status: None   Collection Time: 08/23/19  2:14 AM   Specimen: Nasopharyngeal Swab  Result Value Ref Range Status   SARS Coronavirus 2 by RT PCR NEGATIVE NEGATIVE Final    Comment: (NOTE) SARS-CoV-2 target nucleic acids are NOT DETECTED. The SARS-CoV-2 RNA is generally detectable in upper respiratoy specimens during the acute phase of infection. The lowest concentration of SARS-CoV-2 viral copies this assay can detect is 131 copies/mL. A negative result does not preclude SARS-Cov-2 infection and should not be used as the sole basis for treatment or other patient management decisions. A negative result may occur with  improper specimen collection/handling, submission of specimen other than nasopharyngeal swab, presence of viral mutation(s) within the areas targeted by this assay, and inadequate number of viral copies (<131 copies/mL). A negative result must be combined with clinical observations, patient history, and epidemiological information. The expected result is  Negative. Fact Sheet for Patients:  https://www.moore.com/https://www.fda.gov/media/142436/download Fact Sheet for Healthcare Providers:  https://www.young.biz/https://www.fda.gov/media/142435/download This test is not yet ap proved or cleared by the [place] FDA and  has been authorized for detection and/or diagnosis of SARS-CoV-2 by FDA under an Emergency Use Authorization (EUA). This EUA will remain  in effect (meaning this test can be used) for the duration of the COVID-19 declaration under Section 564(b)(1) of the Act, 21 U.S.C. section 360bbb-3(b)(1), unless the authorization is terminated or revoked sooner.    Influenza A by PCR NEGATIVE NEGATIVE Final   Influenza B by PCR NEGATIVE NEGATIVE Final    Comment: (NOTE) The Xpert Xpress SARS-CoV-2/FLU/RSV assay is intended as an aid in  the diagnosis of influenza from Nasopharyngeal swab specimens and  should not be used as a sole basis for treatment. Nasal washings and  aspirates are unacceptable for Xpert Xpress SARS-CoV-2/FLU/RSV  testing. Fact Sheet for Patients: https://www.moore.com/https://www.fda.gov/media/142436/download Fact Sheet for Healthcare Providers: https://www.young.biz/https://www.fda.gov/media/142435/download This test is not yet approved or cleared by the [place] FDA and  has been authorized for detection and/or diagnosis of SARS-CoV-2 by  FDA under an Emergency Use Authorization (EUA). This EUA will remain  in effect (meaning this test can be used) for the duration of the  Covid-19 declaration under Section 564(b)(1) of the Act, 21  U.S.C. section 360bbb-3(b)(1), unless the authorization is  terminated or revoked. Performed at [facility], [address] [place], [place] [address]      Discharge Instructions:   Discharge Instructions    Amb Referral to Cardiac Rehabilitation   Complete by: As directed    Diagnosis:  Coronary Stents NSTEMI PTCA     After initial evaluation and assessments completed: Virtual Based Care may be provided alone or in conjunction with Phase 2  Cardiac Rehab based on patient barriers.: Yes   Call MD for:   Complete by: As directed    Recurrent chest pain/worsening symptoms   Diet - low sodium heart healthy   Complete by: As directed    Discharge instructions   Complete by: As directed    Follow-up with your primary care physician in 1 week.  Take medications as prescribed.  Follow-up with cardiology after discharge.  Cardiology clinic to schedule an appointment with you. No  smoking please. Use nicotine patch if needed to help quit smoking.   Increase activity slowly   Complete by: As directed      Allergies as of 08/25/2019   No Known Allergies     Medication List    STOP taking these medications   OVER THE COUNTER MEDICATION     TAKE these medications   aspirin 81 MG EC tablet Take 1 tablet (81 mg total) by mouth daily. Start taking on: August 26, 2019   atorvastatin 80 MG tablet Commonly known as: LIPITOR Take 1 tablet (80 mg total) by mouth daily at 6 PM.   metoprolol tartrate 25 MG tablet Commonly known as: LOPRESSOR Take 0.5 tablets (12.5 mg total) by mouth 2 (two) times daily.   nicotine 14 mg/24hr patch Commonly known as: NICODERM CQ - dosed in mg/24 hours Place 1 patch (14 mg total) onto the skin daily.   ticagrelor 90 MG Tabs tablet Commonly known as: BRILINTA Take 1 tablet (90 mg total) by mouth 2 (two) times daily.      Follow-up Information    Primary care provider. Schedule an appointment as soon as possible for a visit in 1 week(s).   Why: regular followup           Time coordinating discharge: 39 minutes  Signed:  Gari Trovato  [facility] 08/25/2019, 9:37 AM

## 2019-08-25 NOTE — Progress Notes (Addendum)
Progress Note  Patient Name: Jill Perry Date of Encounter: 08/25/2019  Primary Cardiologist: Wants to follow up in [place].   Subjective   No chest pain this morning. Just walked with cardiac rehab.   Inpatient Medications    Scheduled Meds:  aspirin EC  81 mg Oral Daily   atorvastatin  80 mg Oral q1800   metoprolol tartrate  12.5 mg Oral BID   sodium chloride flush  3 mL Intravenous Q12H   ticagrelor  90 mg Oral BID   Continuous Infusions:  sodium chloride 125 mL/hr at 08/24/19 2339   sodium chloride     PRN Meds: sodium chloride, acetaminophen, diazepam, nitroGLYCERIN, ondansetron (ZOFRAN) IV, sodium chloride flush   Vital Signs    Vitals:   08/24/19 1853 08/24/19 2121 08/25/19 0500 08/25/19 0605  BP: 128/62 (!) 115/57  111/61  Pulse: 63 (!) 51  (!) 51  Resp: 16 16  17   Temp:  98.1 F (36.7 C)  97.8 F (36.6 C)  TempSrc:  Oral  Oral  SpO2: 98% 99%  99%  Weight:   59.7 kg   Height:        Intake/Output Summary (Last 24 hours) at 08/25/2019 0839 Last data filed at 08/25/2019 0700 Gross per 24 hour  Intake 1196.79 ml  Output --  Net 1196.79 ml   Last 3 Weights 08/25/2019 08/24/2019 08/23/2019  Weight (lbs) 131 lb 11.2 oz 130 lb 4.8 oz 137 lb  Weight (kg) 59.739 kg 59.104 kg 62.143 kg      Telemetry    SB - Personally Reviewed  ECG    SB rate 52 - Personally Reviewed  Physical Exam  Pleasant older WF, sitting on the side of the bed.  GEN: No acute distress.   Neck: No JVD Cardiac: RRR, no murmurs, rubs, or gallops.  Respiratory: Clear to auscultation bilaterally. GI: Soft, nontender, non-distended  MS: No edema; No deformity. Right radial cath site stable.  Neuro:  Nonfocal  Psych: Normal affect   Labs    High Sensitivity Troponin:   Recent Labs  Lab 08/23/19 0100 08/23/19 0257 08/23/19 0916  TROPONINIHS 38* 275* 1,309*      Chemistry Recent Labs  Lab 08/23/19 0100 08/24/19 0719 08/25/19 0310  NA 139 145 143  K 3.6  4.0 4.0  CL 105 110 112*  CO2 27 24 23   GLUCOSE 129* 98 89  BUN 15 11 8   CREATININE 0.66 0.73 0.64  CALCIUM 9.5 9.2 8.6*  PROT 6.9  --   --   ALBUMIN 3.9  --   --   AST 21  --   --   ALT 22  --   --   ALKPHOS 63  --   --   BILITOT 0.5  --   --   GFRNONAA >60 >60 >60  GFRAA >60 >60 >60  ANIONGAP 7 11 8      Hematology Recent Labs  Lab 08/23/19 0100 08/24/19 0719 08/25/19 0310  WBC 9.1 7.4 6.9  RBC 4.29 4.37 3.78*  HGB 13.5 13.8 11.9*  HCT 40.6 41.7 35.9*  MCV 94.6 95.4 95.0  MCH 31.5 31.6 31.5  MCHC 33.3 33.1 33.1  RDW 12.6 12.5 12.6  PLT 208 203 195    BNPNo results for input(s): BNP, PROBNP in the last 168 hours.   DDimer No results for input(s): DDIMER in the last 168 hours.   Radiology    CARDIAC CATHETERIZATION  Result Date: 08/24/2019  Dist RCA lesion is  95% stenosed.  Prox RCA to Mid RCA lesion is 70% stenosed.  1st Mrg lesion is 30% stenosed.  Ost LAD to Mid LAD lesion is 35% stenosed.  3rd Diag lesion is 70% stenosed.  Mid LAD lesion is 70% stenosed.  Post intervention, there is a 0% residual stenosis.  Post intervention, there is a 0% residual stenosis.  Ost RCA to Prox RCA lesion is 20% stenosed.  A stent was successfully placed.  A stent was successfully placed.  Multi-vessel CAD with diffuse 30 to 35% stenoses in the proximal LAD prior to the first diagonal vessel with 70% focal stenosis immediately after the first diagonal vessel and diffuse 70% ostial proximal stenosis in the second diagonal vessel; 30% circumflex marginal-1 stenosis; dominant RCA with mild ostial tapering with 70% diffuse mid stenosis and 95% focal stenosis just proximal to the PDA takeoff distally. LVEDP 7 mmHg. Successful PCI to the RCA with PTCA and ultimate insertion of a 2.0 x 12 mm Resolute Onyx DES stent postdilated 2.25 mm in the distal RCA immediately proximal to the PDA takeoff and PCI/DES stenting of the mid RCA with insertion of a 2.5 x 30 mm Resolute Onyx DES stent  postdilated to 2.75 mm meters with the stenosis being reduced to 0%. RECOMMENDATION: DAPT for minimum of 1 year.  Aggressive lipid-lowering therapy target LDL 60 or below.  Initial medical therapy for concomitant CAD involving the LAD and circumflex marginal vessel.  Smoking cessation is imperative.   ECHOCARDIOGRAM COMPLETE  Result Date: 08/23/2019   ECHOCARDIOGRAM REPORT   Patient Name:   Jill Perry Date of Exam: 08/23/2019 Medical Rec #:  [id number]          Height:       67.0 in Accession #:    [id number]         Weight:       137.0 lb Date of Birth:  02-Sep-1955          BSA:          1.72 m Patient Age:    63 years           BP:           109/64 mmHg Patient Gender: F                  HR:           50 bpm. [name] Procedure: 2D Echo, Color Doppler and Cardiac Doppler Indications:    NSTEMI  History:        Patient has no prior history of Echocardiogram examinations.                 Risk Factors:Current Smoker.  Sonographer:    Irving Burton Senior RDCS Referring Phys: [id number] Lillie Columbia M GADHIA IMPRESSIONS  1. Left ventricular ejection fraction, by visual estimation, is 60 to 65%. The left ventricle has normal function. There is no left ventricular hypertrophy.  2. The left ventricle has no regional wall motion abnormalities.  3. Global right ventricle has normal systolic function.The right ventricular size is normal. No increase in right ventricular wall thickness.  4. Left atrial size was normal.  5. Right atrial size was normal.  6. The mitral valve is normal in structure. No evidence of mitral valve regurgitation. No evidence of mitral stenosis.  7. The tricuspid valve is normal in structure.  8. The tricuspid valve is normal in structure. Tricuspid valve regurgitation is not demonstrated.  9. The aortic  valve is tricuspid. Aortic valve regurgitation is not visualized. No evidence of aortic valve sclerosis or stenosis. 10. The pulmonic valve was not well visualized. Pulmonic valve  regurgitation is not visualized. 11. The inferior vena cava is normal in size with greater than 50% respiratory variability, suggesting right atrial pressure of 3 mmHg. FINDINGS  Left Ventricle: Left ventricular ejection fraction, by visual estimation, is 60 to 65%. The left ventricle has normal function. The left ventricle has no regional wall motion abnormalities. There is no left ventricular hypertrophy. Right Ventricle: The right ventricular size is normal. No increase in right ventricular wall thickness. Global RV systolic function is has normal systolic function. Left Atrium: Left atrial size was normal in size. Right Atrium: Right atrial size was normal in size Pericardium: There is no evidence of pericardial effusion. Mitral Valve: The mitral valve is normal in structure. No evidence of mitral valve regurgitation. No evidence of mitral valve stenosis by observation. Tricuspid Valve: The tricuspid valve is normal in structure. Tricuspid valve regurgitation is not demonstrated. Aortic Valve: The aortic valve is tricuspid. Aortic valve regurgitation is not visualized. The aortic valve is structurally normal, with no evidence of sclerosis or stenosis. Aortic valve mean gradient measures 3.3 mmHg. Aortic valve peak gradient measures 6.5 mmHg. Aortic valve area, by VTI measures 2.34 cm. Pulmonic Valve: The pulmonic valve was not well visualized. Pulmonic valve regurgitation is not visualized. Pulmonic regurgitation is not visualized. No evidence of pulmonic stenosis. Aorta: The aortic root is normal in size and structure. Pulmonary Artery: Indeterminant PASP, inadequate TR jet. Venous: The inferior vena cava is normal in size with greater than 50% respiratory variability, suggesting right atrial pressure of 3 mmHg. IAS/Shunts: No atrial level shunt detected by color flow Doppler.  LEFT VENTRICLE PLAX 2D LVIDd:         4.86 cm  Diastology LVIDs:         4.22 cm  LV e' lateral:   10.90 cm/s LV PW:         0.67 cm   LV E/e' lateral: 8.1 LV IVS:        0.61 cm  LV e' medial:    7.40 cm/s LVOT diam:     1.90 cm  LV E/e' medial:  11.9 LV SV:         31 ml LV SV Index:   18.22 LVOT Area:     2.84 cm  RIGHT VENTRICLE RV S prime:     10.70 cm/s TAPSE (M-mode): 2.0 cm LEFT ATRIUM             Index       RIGHT ATRIUM           Index LA diam:        3.00 cm 1.74 cm/m  RA Area:     16.70 cm LA Vol (A2C):   40.3 ml 23.40 ml/m RA Volume:   47.30 ml  27.47 ml/m LA Vol (A4C):   22.6 ml 13.13 ml/m LA Biplane Vol: 32.1 ml 18.64 ml/m  AORTIC VALVE AV Area (Vmax):    2.19 cm AV Area (Vmean):   2.19 cm AV Area (VTI):     2.34 cm AV Vmax:           127.02 cm/s AV Vmean:          86.015 cm/s AV VTI:            0.300 m AV Peak Grad:      6.5 mmHg  AV Mean Grad:      3.3 mmHg LVOT Vmax:         98.30 cm/s LVOT Vmean:        66.500 cm/s LVOT VTI:          0.247 m LVOT/AV VTI ratio: 0.82  AORTA Ao Root diam: 2.30 cm MITRAL VALVE MV Area (PHT): 3.37 cm              SHUNTS MV PHT:        65.25 msec            Systemic VTI:  0.25 m MV Decel Time: 225 msec              Systemic Diam: 1.90 cm MV E velocity: 87.80 cm/s  103 cm/s MV A velocity: 101.00 cm/s 70.3 cm/s MV E/A ratio:  0.87        1.5  Carlyle Dolly MD Electronically signed by Carlyle Dolly MD Signature Date/Time: 08/23/2019/12:34:25 PM    Final     Cardiac Studies   Cath: 08/24/19   Dist RCA lesion is 95% stenosed.  Prox RCA to Mid RCA lesion is 70% stenosed.  1st Mrg lesion is 30% stenosed.  Ost LAD to Mid LAD lesion is 35% stenosed.  3rd Diag lesion is 70% stenosed.  Mid LAD lesion is 70% stenosed.  Post intervention, there is a 0% residual stenosis.  Post intervention, there is a 0% residual stenosis.  Ost RCA to Prox RCA lesion is 20% stenosed.  A stent was successfully placed.  A stent was successfully placed.   Multi-vessel CAD with diffuse 30 to 35% stenoses in the proximal LAD prior to the first diagonal vessel with 70% focal stenosis immediately  after the first diagonal vessel and diffuse 70% ostial proximal stenosis in the second diagonal vessel; 30% circumflex marginal-1 stenosis; dominant RCA with mild ostial tapering with 70% diffuse mid stenosis and 95% focal stenosis just proximal to the PDA takeoff distally.  LVEDP 7 mmHg.  Successful PCI to the RCA with PTCA and ultimate insertion of a 2.0 x 12 mm Resolute Onyx DES stent postdilated 2.25 mm in the distal RCA immediately proximal to the PDA takeoff and PCI/DES stenting of the mid RCA with insertion of a 2.5 x 30 mm Resolute Onyx DES stent postdilated to 2.75 mm meters with the stenosis being reduced to 0%.  RECOMMENDATION: DAPT for minimum of 1 year.  Aggressive lipid-lowering therapy target LDL 60 or below.  Initial medical therapy for concomitant CAD involving the LAD and circumflex marginal vessel.  Smoking cessation is imperative.  Diagnostic Dominance: Right  Intervention    TTE: 08/23/19  IMPRESSIONS    1. Left ventricular ejection fraction, by visual estimation, is 60 to  65%. The left ventricle has normal function. There is no left ventricular  hypertrophy.  2. The left ventricle has no regional wall motion abnormalities.  3. Global right ventricle has normal systolic function.The right  ventricular size is normal. No increase in right ventricular wall  thickness.  4. Left atrial size was normal.  5. Right atrial size was normal.  6. The mitral valve is normal in structure. No evidence of mitral valve  regurgitation. No evidence of mitral stenosis.  7. The tricuspid valve is normal in structure.  8. The tricuspid valve is normal in structure. Tricuspid valve  regurgitation is not demonstrated.  9. The aortic valve is tricuspid. Aortic valve regurgitation is not  visualized. No evidence of aortic valve  sclerosis or stenosis.  10. The pulmonic valve was not well visualized. Pulmonic valve  regurgitation is not visualized.  11. The inferior vena  cava is normal in size with greater than 50%  respiratory variability, suggesting right atrial pressure of 3 mmHg.   Patient Profile     64 y.o. female with no significant PMH who presented with chest pain to [facility] and found to have a NSTEMI. Cardiology called for consult. Strong family hx of CAD with family having angioplasty, and brother with stents several years ago.   Assessment & Plan    1. [facility]I: [facility]/31 described as a burning sensation with radiation into her throat and left shoulder. EKG showed SB with no acute ST/TW changes. hsTn peaking at 1309. Underwent cardiac cath yesterday noted above with PCI/DES to dRCA and pPDA. Did have residual disease in the LAD and 3rd diag to be treated medically. Placed on DAPT with ASA/Brilinta for at least one year. Continue on low dose BB as HR prevents further titration.  -- worked well with cardiac rehab without recurrent chest pain.    2. Tobacco use: cessation strongly advised.   3. HL: was told by her PCP that her cholesterol was not great but she wanted to try diet first. LDL noted at 170. -- statin started on admission. Will need FLP/LFTs in 8 weeks.   Signed, Laverda PageLindsay Roberts, NP  08/25/2019, 8:39 AM     Patient seen and examined this morning on rounds along with Laverda PageLindsay Roberts, NP.  Together reviewed the case with all available data and examined the patient and the chart.  I agree with her findings, examination and recommendations above.  Patient had 2 DES stents placed in the RCA is now on aspirin and Brilinta.  Low-dose beta-blocker started.  We will also start a statin as her lipids were moderately controlled.  [place] be [place].  [facility] will sign off.   Medication Recommendations:  Noted above Other recommendations (labs, testing, etc):  none Follow up as an outpatient:  Will arrange outpatient follow up   Bryan Lemmaavid Jarrius Huaracha, M.D.,  M.S. Interventional Cardiologist   Pager # [phone] Phone # [phone] [address] [place], [place] [address]    For questions or updates, please contact [facility] Please consult [email or web address] for contact info under

## 2019-08-25 NOTE — Progress Notes (Signed)
CARDIAC REHAB PHASE I   PRE:  Rate/Rhythm: 60 SR    BP: sitting 108/55    SaO2:   MODE:  Ambulation: 600 ft   POST:  Rate/Rhythm: 76 SR    BP: sitting 111/52     SaO2:   Tolerated well, feels good. Pt very energetic by nature and moving around room when I arrived. VSS although BP on low side. Discussed MI, stents, Brilinta, restrictions, diet, smoking cessation, exercise, NTG and CRPII. Will refer to [facility] CRPII. Pt very receptive and ready to quit smoking. Understands importance of Brilinta. [id number]   Harriet Masson CES, ACSM 08/25/2019 8:44 AM

## 2019-08-25 NOTE — Care Management (Signed)
Per  [facility],Co-pay amount for Brilinta 90 mg. bid for 30 day supply after deductible has been met is; $171.09.  Since deductible has not been met the cost will be $369.19 for 30 days supply bid.  No PA required Deductible of $330.00 has not been met. Tier 4 medication Retail pharmacy: [facility].

## 2019-08-25 NOTE — Discharge Instructions (Signed)
Heart Attack The heart is a muscle that needs oxygen to survive. A heart attack is a condition that occurs when your heart does not get enough oxygen. When this happens, the heart muscle begins to die. This can cause permanent damage if not treated right away. A heart attack is a medical emergency. This condition may be called a myocardial infarction, or MI. It is also known as acute coronary syndrome (ACS). ACS is a term used to describe a group of conditions that affect blood flow to the heart. What are the causes? This condition may be caused by:  Atherosclerosis. This occurs when a fatty substance called plaque builds up in the arteries and blocks or reduces blood supply to the heart.  A blood clot. A blood clot can develop suddenly when plaque breaks up within an artery and blocks blood flow to the heart.  Low blood pressure.  An abnormal heartbeat (arrhythmia).  Conditions that cause a decrease of oxygen to the heart, such as anemiaorrespiratory failure.  A spasm, or severe tightening, of a blood vessel that cuts off blood flow to the heart.  Tearing of a coronary artery (spontaneous coronary artery dissection).  High blood pressure. What increases the risk? The following factors may make you more likely to develop this condition:  Aging. The older you are, the higher your risk.  Having a personal or family history of chest pain, heart attack, stroke, or narrowing of the arteries in the legs, arms, head, or stomach (peripheral artery disease).  Being female.  Smoking.  Not getting regular exercise.  Being overweight or obese.  Having high blood pressure.  Having high cholesterol (hypercholesterolemia).  Having diabetes.  Drinking too much alcohol.  Using illegal drugs, such as cocaine or methamphetamine. What are the signs or symptoms? Symptoms of this condition may vary, depending on factors like gender and age. Symptoms may include:  Chest pain. It may feel  like: ? Crushing or squeezing. ? Tightness, pressure, fullness, or heaviness.  Pain in the arm, neck, jaw, back, or upper body.  Shortness of breath.  Heartburn or upset stomach.  Nausea.  Sudden cold sweats.  Feeling tired.  Sudden light-headedness. How is this diagnosed? This condition may be diagnosed through tests, such as:  Electrocardiogram (ECG) to measure the electrical activity of your heart.  Blood tests to check for cardiac markers. These chemicals are released by a damaged heart muscle.  A test to evaluate blood flow and heart function (coronary angiogram).  CT scan to see the heart more clearly.  A test to evaluate the pumping action of the heart (echocardiogram). How is this treated? A heart attack must be treated as soon as possible. Treatment may include:  Medicines to: ? Break up or dissolve blood clots (fibrinolytic therapy). ? Thin blood and help prevent blood clots. ? Treat blood pressure. ? Improve blood flow to the heart. ? Reduce pain. ? Reduce cholesterol.  Angioplasty and stent placement. These are procedures to widen a blocked artery and keep it open.  Coronary artery bypass graft, CABG, or open heart surgery. This enables blood to flow to the heart by going around the blocked part of the artery.  Oxygen therapy if needed.  Cardiac rehabilitation. This improves your health and well-being through exercise, education, and counseling. Follow these instructions at home: Medicines  Take over-the-counter and prescription medicines only as told by your health care provider.  Do not take the following medicines unless your health care provider says it is okay   to take them: ? NSAIDs, such as ibuprofen. ? Supplements that contain vitamin A, vitamin E, or both. ? Hormone replacement therapy that contains estrogen with or without progestin. Lifestyle   Do not use any products that contain nicotine or tobacco, such as cigarettes, e-cigarettes,  and chewing tobacco. If you need help quitting, ask your health care provider.  Avoid secondhand smoke.  Exercise regularly. Ask your health care provider about participating in a cardiac rehabilitation program that helps you start exercising safely after a heart attack.  Eat a heart-healthy diet. Your health care provider will tell you what foods to eat.  Maintain a healthy weight.  Learn ways to manage stress.  Do not use illegal drugs. Alcohol use  Do not drink alcohol if: ? Your health care provider tells you not to drink. ? You are pregnant, may be pregnant, or are planning to become pregnant.  If you drink alcohol: ? Limit how much you use to:  0-1 drink a day for women.  0-2 drinks a day for men. ? Be aware of how much alcohol is in your drink. In the [place] one drink equals one 12 oz bottle of beer (355 mL), one 5 oz glass of wine (148 mL), or one 1 oz glass of hard liquor (44 mL). General instructions  Work with your health care provider to manage any other conditions you have, such as high blood pressure or diabetes. These conditions affect your heart.  Get screened for depression, and seek treatment if needed.  Keep your vaccinations up to date. Get the flu vaccine every year.  Keep all follow-up visits as told by your health care provider. This is important. Contact a health care provider if:  You feel overwhelmed or sad.  You have trouble doing your daily activities. Get help right away if:  You have sudden, unexplained discomfort in your chest, arms, back, neck, jaw, or upper body.  You have shortness of breath.  You suddenly start to sweat or your skin gets clammy.  You feel nauseous or you vomit.  You have unexplained tiredness or weakness.  You suddenly feel light-headed or dizzy.  You notice your heart starts to beat fast or feels like it is skipping beats.  You have blood pressure that is higher than 180/120. These symptoms may represent a  serious problem that is an emergency. Do not wait to see if the symptoms will go away. Get medical help right away. Call your local emergency services (911 in the [place] Do not drive yourself to the hospital. Summary  A heart attack, also called myocardial infarction, is a condition that occurs when your heart does not get enough oxygen. This is caused by anything that blocks or reduces blood flow to the heart.  Treatment is a combination of medicines and surgeries, if needed, to open the blocked arteries and restore blood flow to the heart.  A heart attack is an emergency. Get help right away if you have sudden discomfort in your chest, arms, back, neck, jaw, or upper body. Seek help if you feel nauseous, you vomit, or you feel light-headed or dizzy. This information is not intended to replace advice given to you by your health care provider. Make sure you discuss any questions you have with your health care provider. Document Revised: 10/16/2018 Document Reviewed: 10/20/2018 Elsevier Patient Education  2020 [employer]vier Inc.  

## 2019-08-31 ENCOUNTER — Other Ambulatory Visit: Payer: Self-pay | Admitting: Cardiology

## 2019-09-02 ENCOUNTER — Encounter: Payer: Self-pay | Admitting: Student

## 2019-09-02 ENCOUNTER — Other Ambulatory Visit: Payer: Self-pay

## 2019-09-02 ENCOUNTER — Ambulatory Visit (INDEPENDENT_AMBULATORY_CARE_PROVIDER_SITE_OTHER): Payer: Medicare Other | Admitting: Student

## 2019-09-02 VITALS — BP 110/63 | HR 54 | Temp 97.8°F | Ht 67.0 in | Wt 131.4 lb

## 2019-09-02 DIAGNOSIS — Z720 Tobacco use: Secondary | ICD-10-CM

## 2019-09-02 DIAGNOSIS — Z79899 Other long term (current) drug therapy: Secondary | ICD-10-CM

## 2019-09-02 DIAGNOSIS — I2510 Atherosclerotic heart disease of native coronary artery without angina pectoris: Secondary | ICD-10-CM | POA: Diagnosis not present

## 2019-09-02 DIAGNOSIS — I214 Non-ST elevation (NSTEMI) myocardial infarction: Secondary | ICD-10-CM | POA: Diagnosis not present

## 2019-09-02 DIAGNOSIS — E785 Hyperlipidemia, unspecified: Secondary | ICD-10-CM | POA: Diagnosis not present

## 2019-09-02 MED ORDER — METOPROLOL TARTRATE 25 MG PO TABS: 12.5000 mg | ORAL_TABLET | Freq: Two times a day (BID) | ORAL | 2 refills | Status: DC

## 2019-09-02 MED ORDER — ATORVASTATIN CALCIUM 80 MG PO TABS: 80.0000 mg | ORAL_TABLET | Freq: Every day | ORAL | 3 refills | Status: DC

## 2019-09-02 MED ORDER — TICAGRELOR 90 MG PO TABS: 90.0000 mg | ORAL_TABLET | Freq: Two times a day (BID) | ORAL | 11 refills | Status: DC

## 2019-09-02 MED ORDER — ATORVASTATIN CALCIUM 80 MG PO TABS: 80.0000 mg | ORAL_TABLET | Freq: Every day | ORAL | 2 refills | Status: DC

## 2019-09-02 NOTE — Patient Instructions (Signed)
Medication Instructions:  Your physician recommends that you continue on your current medications as directed. Please refer to the Current Medication list given to you today.  *If you need a refill on your cardiac medications before your next appointment, please call your pharmacy*  Lab Work: Your physician recommends that you return for lab work in: 2 MONTHS   If you have labs (blood work) drawn today and your tests are completely normal, you will receive your results only by: Marland Kitchen MyChart Message (if you have MyChart) OR . A paper copy in the mail If you have any lab test that is abnormal or we need to change your treatment, we will call you to review the results.  Testing/Procedures: NONE   Follow-Up: At [facility], you and your health needs are our priority.  As part of our continuing mission to provide you with exceptional heart care, we have created designated Provider Care Teams.  These Care Teams include your primary Cardiologist (physician) and Advanced Practice Providers (APPs -  Physician Assistants and Nurse Practitioners) who all work together to provide you with the care you need, when you need it.  Your next appointment:   3 month(s)  The format for your next appointment:   In Person  Provider:   You may see No primary care provider on file. or one of the following Advanced Practice Providers on your designated Care Team:    Randall An, PA-C   Jacolyn Reedy, [place]    Other Instructions Thank you for choosing [facility]!

## 2019-09-02 NOTE — Progress Notes (Signed)
Cardiology Office Note    Date:  09/02/2019   ID:  Jill Perry, DOB 1956-02-02, MRN [id number]  PCP:  Jill Stabile, MD  Cardiologist: Evaluated by Dr. Herbie Perry during admission but wishes to follow-up in [facility] Complaint  Patient presents with  . Hospitalization Follow-up    s/p NSTEMI    History of Present Illness:    Jill GREENING is a 64 y.o. female with past medical history of tobacco use and family history of CAD who presents to the office today for hospital follow-up.  She most recently presented to [facility] ED on 08/23/2019 evaluation of chest pain which radiated into her left shoulder and throat. Reported symptoms were worse with exertion and would improve with rest. She was found to have an NSTEMI with HS Troponin values trending upwards from 38 to 275 then 1309. Cardiac catheterization was performed by Dr. Tresa Perry and showed multivessel CAD with 30 to 35% proximal LAD stenosis, 70% mid LAD stenosis, 70% third diagonal stenosis, 30% 1st Mrg stenosis, 70% prox-mid RCA stenosis and 95% distal RCA stenosis. She underwent successful PCI with DES placement to the mid-RCA and DES to distal RCA. Medical management of her residual CAD was recommended. She was started on ASA and Brilinta with instructions to continue DAPT for at least 1 year. She was also started on Atorvastatin 80mg  daily and Lopressor 12.5mg  BID.   In talking with the patient today, she reports overall doing well since her hospitalization and denies any recurrent chest pain which she describes as a burning at the time of her event.  Breathing has overall been at baseline and she denies any orthopnea, PND or lower extremity edema. No palpitations or dizziness.  She reports good compliance with her medication regimen and denies missing any doses of ASA or Brilinta. No reports of active bleeding.  She was previously smoking 0.5 ppd and says she has reduced her use since hospital discharge.     Past Medical History:  Diagnosis Date  . CAD (coronary artery disease)    a. s/p NSTEMI in 07/2019 with DES to mid-RCA and DES to distal-RCA.   08/2019 Hyperlipidemia LDL goal <70   . Tobacco use     Past Surgical History:  Procedure Laterality Date  . CORONARY STENT INTERVENTION N/A 08/24/2019   Procedure: CORONARY STENT INTERVENTION;  Surgeon: 10/22/2019, MD;  Location: [facility] INVASIVE CV LAB;  Service: Cardiovascular;  Laterality: N/A;  . LEFT HEART CATH AND CORONARY ANGIOGRAPHY N/A 08/24/2019   Procedure: LEFT HEART CATH AND CORONARY ANGIOGRAPHY;  Surgeon: 10/22/2019, MD;  Location: [facility];  Service: Cardiovascular;  Laterality: N/A;    Current Medications: Outpatient Medications Prior to Visit  Medication Sig Dispense Refill  . aspirin EC 81 MG EC tablet Take 1 tablet (81 mg total) by mouth daily. 30 tablet 11  . atorvastatin (LIPITOR) 80 MG tablet Take 1 tablet (80 mg total) by mouth daily at 6 PM. 30 tablet 2  . metoprolol tartrate (LOPRESSOR) 25 MG tablet Take 0.5 tablets (12.5 mg total) by mouth 2 (two) times daily. 60 tablet 2  . nicotine (NICODERM CQ - DOSED IN MG/24 HOURS) 14 mg/24hr patch Place 1 patch (14 mg total) onto the skin daily. 30 patch 0  . ticagrelor (BRILINTA) 90 MG TABS tablet Take 1 tablet (90 mg total) by mouth 2 (two) times daily. 60 tablet 11   No facility-administered medications prior to visit.  Allergies:   Patient has no known allergies.   Social History   Socioeconomic History  . Marital status: Married    Spouse name: Not on file  . Number of children: Not on file  . Years of education: Not on file  . Highest education level: Not on file  Occupational History  . Not on file  Tobacco Use  . Smoking status: Current Some Day Smoker  . Smokeless tobacco: Never Used  Substance and Sexual Activity  . Alcohol use: Never  . Drug use: Never  . Sexual activity: Not Currently  Other Topics Concern  . Not on file  Social  History Narrative  . Not on file   Social Determinants of Health   Financial Resource Strain:   . Difficulty of Paying Living Expenses: Not on file  Food Insecurity:   . Worried About Charity fundraiser in the Last Year: Not on file  . Ran Out of Food in the Last Year: Not on file  Transportation Needs:   . Lack of Transportation (Medical): Not on file  . Lack of Transportation (Non-Medical): Not on file  Physical Activity:   . Days of Exercise per Week: Not on file  . Minutes of Exercise per Session: Not on file  Stress:   . Feeling of Stress : Not on file  Social Connections:   . Frequency of Communication with Friends and Family: Not on file  . Frequency of Social Gatherings with Friends and Family: Not on file  . Attends Religious Services: Not on file  . Active Member of Clubs or Organizations: Not on file  . Attends Archivist Meetings: Not on file  . Marital Status: Not on file     Family History:  The patient's family history includes Diabetes in her mother; Hypertension in her mother.   Review of Systems:   Please see the history of present illness.     General:  No chills, fever, night sweats or weight changes.  Cardiovascular:  No chest pain, dyspnea on exertion, edema, orthopnea, palpitations, paroxysmal nocturnal dyspnea. Dermatological: No rash, lesions/masses Respiratory: No cough, dyspnea Urologic: No hematuria, dysuria Abdominal:   No nausea, vomiting, diarrhea, bright red blood per rectum, melena, or hematemesis Neurologic:  No visual changes, wkns, changes in mental status.  She denies any of the above symptoms.   All other systems reviewed and are otherwise negative except as noted above.   Physical Exam:    VS:  BP 110/63   Pulse (!) 54   Temp 97.8 F (36.6 C)   Ht 5\' 7"  (1.702 m)   Wt 131 lb 6.4 oz (59.6 kg)   SpO2 99%   BMI 20.58 kg/m    General: Well developed, well nourished,female appearing in no acute distress. Head:  Normocephalic, atraumatic, sclera non-icteric, no xanthomas, nares are without discharge.  Neck: No carotid bruits. JVD not elevated.  Lungs: Respirations regular and unlabored, without wheezes or rales.  Heart: Regular rate and rhythm. No S3 or S4.  No murmur, no rubs, or gallops appreciated. Abdomen: Soft, non-tender, non-distended with normoactive bowel sounds. No hepatomegaly. No rebound/guarding. No obvious abdominal masses. Msk:  Strength and tone appear normal for age. No joint deformities or effusions. Extremities: No clubbing or cyanosis. No lower extremity edema.  Distal pedal pulses are 2+ bilaterally. Ecchymosis along radial cath site but no swelling or evidence of a hematoma.  Neuro: Alert and oriented X 3. Moves all extremities spontaneously. No focal deficits noted.  Psych:  Responds to questions appropriately with a normal affect. Skin: No rashes or lesions noted  Wt Readings from Last 3 Encounters:  09/02/19 131 lb 6.4 oz (59.6 kg)  08/25/19 131 lb 11.2 oz (59.7 kg)     Studies/Labs Reviewed:   EKG:  EKG is ordered today. The ekg ordered today demonstrates sinus bradycardia, HR 56 with no acute ST changes.   Recent Labs: 08/23/2019: ALT 22 08/25/2019: BUN 8; Creatinine, Ser 0.64; Hemoglobin 11.9; Platelets 195; Potassium 4.0; Sodium 143   Lipid Panel    Component Value Date/Time   CHOL 244 (H) 08/24/2019 0719   TRIG 88 08/24/2019 0719   HDL 55 08/24/2019 0719   CHOLHDL 4.4 08/24/2019 0719   VLDL 18 08/24/2019 0719   LDLCALC 171 (H) 08/24/2019 0719    Additional studies/ records that were reviewed today include:   Echocardiogram: 07/2019 IMPRESSIONS    1. Left ventricular ejection fraction, by visual estimation, is 60 to  65%. The left ventricle has normal function. There is no left ventricular  hypertrophy.  2. The left ventricle has no regional wall motion abnormalities.  3. Global right ventricle has normal systolic function.The right  ventricular size  is normal. No increase in right ventricular wall  thickness.  4. Left atrial size was normal.  5. Right atrial size was normal.  6. The mitral valve is normal in structure. No evidence of mitral valve  regurgitation. No evidence of mitral stenosis.  7. The tricuspid valve is normal in structure.  8. The tricuspid valve is normal in structure. Tricuspid valve  regurgitation is not demonstrated.  9. The aortic valve is tricuspid. Aortic valve regurgitation is not  visualized. No evidence of aortic valve sclerosis or stenosis.  10. The pulmonic valve was not well visualized. Pulmonic valve  regurgitation is not visualized.  11. The inferior vena cava is normal in size with greater than 50%  respiratory variability, suggesting right atrial pressure of 3 mmHg.   Cardiac Catheterization: 08/2019  Dist RCA lesion is 95% stenosed.  Prox RCA to Mid RCA lesion is 70% stenosed.  1st Mrg lesion is 30% stenosed.  Ost LAD to Mid LAD lesion is 35% stenosed.  3rd Diag lesion is 70% stenosed.  Mid LAD lesion is 70% stenosed.  Post intervention, there is a 0% residual stenosis.  Post intervention, there is a 0% residual stenosis.  Ost RCA to Prox RCA lesion is 20% stenosed.  A stent was successfully placed.  A stent was successfully placed.   Multi-vessel CAD with diffuse 30 to 35% stenoses in the proximal LAD prior to the first diagonal vessel with 70% focal stenosis immediately after the first diagonal vessel and diffuse 70% ostial proximal stenosis in the second diagonal vessel; 30% circumflex marginal-1 stenosis; dominant RCA with mild ostial tapering with 70% diffuse mid stenosis and 95% focal stenosis just proximal to the PDA takeoff distally.  LVEDP 7 mmHg.  Successful PCI to the RCA with PTCA and ultimate insertion of a 2.0 x 12 mm Resolute Onyx DES stent postdilated 2.25 mm in the distal RCA immediately proximal to the PDA takeoff and PCI/DES stenting of the mid RCA with  insertion of a 2.5 x 30 mm Resolute Onyx DES stent postdilated to 2.75 mm meters with the stenosis being reduced to 0%.  RECOMMENDATION: DAPT for minimum of 1 year.  Aggressive lipid-lowering therapy target LDL 60 or below.  Initial medical therapy for concomitant CAD involving the LAD and circumflex marginal vessel.  Smoking cessation is imperative.  Assessment:    1. Coronary artery disease involving native coronary artery of native heart without angina pectoris   2. Non-ST elevation myocardial infarction (NSTEMI), subsequent episode of care (HCC)   3. Hyperlipidemia LDL goal <70   4. Medication management   5. Tobacco use      Plan:   In order of problems listed above:  1. CAD/Subsequent Episode of Care for NSTEMI - she is s/p NSTEMI with catheterization showing 30 to 35% proximal LAD stenosis, 70% mid LAD stenosis, 70% third diagonal stenosis, 30% 1st Mrg stenosis, 70% prox-mid RCA stenosis and 95% distal RCA stenosis as outlined above. Underwent successful PCI with DES placement to the mid-RCA and DES to distal RCA. Cath report reviewed in detail with the patient today.  -She reports overall doing well since her recent admission and denies any recurrent chest pain.   - Continued compliance with ASA and Brilinta was encouraged. She is unsure of what her co-pay will be for Brilinta, therefore I encouraged her to contact the office if this is unaffordable as we may need to switch to Plavix. Remains on Lopressor 12.5 mg twice daily (would not further titrate given HR in the 50's) and statin therapy.   2. HLD - FLP during recent admission showed total cholesterol 244, triglycerides 88, HDL 55 and LDL 171. She was started on Atorvastatin 80 mg daily and has overall been tolerating this well. Will plan for repeat FLP and LFT's in 6-8 weeks.   3. Tobacco Use - She was previously smoking 0.5 ppd but has reduced her use to approximately 0.25 ppd. Congratulated on her reduction with cessation  advised.    Medication Adjustments/Labs and Tests Ordered: Current medicines are reviewed at length with the patient today.  Concerns regarding medicines are outlined above.  Medication changes, Labs and Tests ordered today are listed in the Patient Instructions below. Patient Instructions  Medication Instructions:  Your physician recommends that you continue on your current medications as directed. Please refer to the Current Medication list given to you today.  *If you need a refill on your cardiac medications before your next appointment, please call your pharmacy*  Lab Work: Your physician recommends that you return for lab work in: 2 MONTHS   If you have labs (blood work) drawn today and your tests are completely normal, you will receive your results only by: Marland Kitchen MyChart Message (if you have MyChart) OR . A paper copy in the mail If you have any lab test that is abnormal or we need to change your treatment, we will call you to review the results.  Testing/Procedures: [facility]   Follow-Up: At [facility], you and your health needs are our priority.  As part of our continuing mission to provide you with exceptional heart care, we have created designated Provider Care Teams.  These Care Teams include your primary Cardiologist (physician) and Advanced Practice Providers (APPs -  Physician Assistants and Nurse Practitioners) who all work together to provide you with the care you need, when you need it.  Your next appointment:   3 month(s)  The format for your next appointment:   In Person  Provider:   You may see No primary care provider on file. or one of the following Advanced Practice Providers on your designated Care Team:    Randall An, PA-C   Jacolyn Reedy, [place]    Other Instructions Thank you for choosing [facility]!    Signed, Lennart Pall  Iran Ouch, PA-C  09/02/2019 5:29 PM    [facility] [address] [place], [place]  [address] Phone: [phone] Fax: [phone]

## 2019-09-22 ENCOUNTER — Telehealth: Payer: Self-pay

## 2019-09-22 ENCOUNTER — Other Ambulatory Visit: Payer: Self-pay

## 2019-09-22 MED ORDER — CLOPIDOGREL BISULFATE 75 MG PO TABS: ORAL_TABLET | ORAL | 3 refills | Status: DC

## 2019-09-22 NOTE — Telephone Encounter (Signed)
Sent in an RX for Plavix. Will make pt aware.

## 2019-09-22 NOTE — Telephone Encounter (Signed)
Returned call. Insurance did not want to cover the plavix written with extra (4) tablets. She will break up the loading done into a single prescription.

## 2019-09-22 NOTE — Telephone Encounter (Signed)
Shannon,[facility] would like to make changes to (clopidogrel) how prescription was ordered by Brittany,PA-C.  Nira Conn [phone]  Thanks renee

## 2019-09-22 NOTE — Telephone Encounter (Signed)
     Would finish her current bottle of Brilinta then switch to Plavix. She would need to take 300mg  of Plavix (4 of the 75mg  tablets on the first day) then 75mg  daily afterwards.   Signed, , PA-C 09/22/2019, 11:04 AM Pager: [phone]

## 2019-09-22 NOTE — Telephone Encounter (Signed)
Pt walked in to inform us that she cannot afford Brilinta. She stated she would like a different medication. Dr. Margo Aye told her since he did not start it, she weill have to get it changed by cardiologist.

## 2019-11-02 LAB — LIPID PANEL
Cholesterol: 118 mg/dL (ref <200)
HDL: 40 mg/dL — ABNORMAL LOW (ref >=50)
LDL Cholesterol (Calc): 63 mg/dL (calc)
Non-HDL Cholesterol (Calc): 78 mg/dL (calc) (ref <130)
Total CHOL/HDL Ratio: 3 (calc) (ref <5.0)
Triglycerides: 70 mg/dL (ref <150)

## 2019-11-02 LAB — HEPATIC FUNCTION PANEL
AG Ratio: 1.8 (calc) (ref 1.0–2.5)
ALT: 170 U/L — ABNORMAL HIGH (ref 6–29)
AST: 98 U/L — ABNORMAL HIGH (ref 10–35)
Albumin: 4 g/dL (ref 3.6–5.1)
Alkaline phosphatase (APISO): 224 U/L — ABNORMAL HIGH (ref 37–153)
Bilirubin, Direct: 0.2 mg/dL (ref 0.0–0.2)
Globulin: 2.2 g/dL (calc) (ref 1.9–3.7)
Indirect Bilirubin: 0.6 mg/dL (calc) (ref 0.2–1.2)
Total Bilirubin: 0.8 mg/dL (ref 0.2–1.2)
Total Protein: 6.2 g/dL (ref 6.1–8.1)

## 2019-11-04 ENCOUNTER — Telehealth: Payer: Self-pay | Admitting: *Deleted

## 2019-11-04 DIAGNOSIS — E785 Hyperlipidemia, unspecified: Secondary | ICD-10-CM

## 2019-11-04 MED ORDER — ATORVASTATIN CALCIUM 40 MG PO TABS: 40.0000 mg | ORAL_TABLET | Freq: Every day | ORAL | 3 refills | Status: DC

## 2019-11-04 NOTE — Addendum Note (Signed)
Addended by: Kerney Elbe on: 11/04/2019 05:00 PM   Modules accepted: Orders

## 2019-11-04 NOTE — Telephone Encounter (Signed)
Called patient with test results. No answer. Left message to call back.  

## 2019-11-04 NOTE — Telephone Encounter (Signed)
Returning call.

## 2019-11-04 NOTE — Telephone Encounter (Signed)
-----  Message from Erma Heritage, [place] sent at 11/02/2019  4:34 PM EDT ----- Please let the patient know her cholesterol has significantly improved as total cholesterol is down from 244 to 118 and LDL is at 63. Unfortunately, her liver function tests are elevated. Has she been experiencing any abdominal pain or discomfort? Her Alk Phos was elevated which is typically associated with the gallbladder. Her AST and ALT were elevated as well. Please have her reduce Atorvastatin to 80m daily and recheck FLP and LFT's in 3-4 weeks. If having any abdominal discomfort or symptoms, she should follow-up with her PCP and will likely need GI evaluation. If asymptomatic, would adjust medications and recheck as outlined above. Also, please confirm no alcohol use as it was listed in her chart she does not consume this. Thank you. Please forward a copy of results to HCelene Squibb MD.

## 2019-11-04 NOTE — Telephone Encounter (Signed)
-----  Message from Brittany M Strader, PA-C sent at 11/02/2019  4:34 PM EDT ----- Please let the patient know her cholesterol has significantly improved as total cholesterol is down from 244 to 118 and LDL is at 63. Unfortunately, her liver function tests are elevated. Has she been experiencing any abdominal pain or discomfort? Her Alk Phos was elevated which is typically associated with the gallbladder. Her AST and ALT were elevated as well. Please have her reduce Atorvastatin to 40mg daily and recheck FLP and LFT's in 3-4 weeks. If having any abdominal discomfort or symptoms, she should follow-up with her PCP and will likely need GI evaluation. If asymptomatic, would adjust medications and recheck as outlined above. Also, please confirm no alcohol use as it was listed in her chart she does not consume this. Thank you. Please forward a copy of results to Hall, John Z, MD.  

## 2019-11-30 ENCOUNTER — Encounter: Payer: Self-pay | Admitting: Cardiology

## 2019-11-30 ENCOUNTER — Other Ambulatory Visit: Payer: Self-pay

## 2019-11-30 ENCOUNTER — Ambulatory Visit (INDEPENDENT_AMBULATORY_CARE_PROVIDER_SITE_OTHER): Payer: Medicare Other | Admitting: Cardiology

## 2019-11-30 VITALS — BP 102/62 | HR 44 | Ht 67.0 in | Wt 127.0 lb

## 2019-11-30 DIAGNOSIS — I2510 Atherosclerotic heart disease of native coronary artery without angina pectoris: Secondary | ICD-10-CM

## 2019-11-30 NOTE — Progress Notes (Signed)
Clinical Summary Ms. Ebron is a 64 y.o.female seen today for follow up of the following medical problems.   1. CAD - admission 08/2019 with NSTEMI - peak hstrop 1300 - cath as reported below, received DES x2 to RCA, residual LAD/diag disease treated medically - Jan 2021 echo 60-65%, no WMAs,  - diffiuclty affording brillinta, changed to plavix  - no chest pain. No SOB/DOE - compliant with meds.    2. Hyperlipidemia - started atorva 55m dur ing recent admission. LDL at that time was 171 - repeat LDL down to 63 - atorva was lowered to 472mdaily due to elevation of LFTs (AST 98 ALT 170 alk phos 224)  Has repeat labs this week.   3. Bradycardia - no symptoms.   Past Medical History:  Diagnosis Date  . CAD (coronary artery disease)    a. s/p NSTEMI in 07/2019 with DES to mid-RCA and DES to distal-RCA.   . Marland Kitchenyperlipidemia LDL goal <70   . Tobacco use      No Known Allergies   Current Outpatient Medications  Medication Sig Dispense Refill  . aspirin EC 81 MG EC tablet Take 1 tablet (81 mg total) by mouth daily. 30 tablet 11  . atorvastatin (LIPITOR) 40 MG tablet Take 1 tablet (40 mg total) by mouth daily. 90 tablet 3  . clopidogrel (PLAVIX) 75 MG tablet Take 300 mg (4 tablets) on day one, then take 75 mg (1 tablet) daily. 94 tablet 3  . metoprolol tartrate (LOPRESSOR) 25 MG tablet Take 0.5 tablets (12.5 mg total) by mouth 2 (two) times daily. 60 tablet 2   No current facility-administered medications for this visit.     Past Surgical History:  Procedure Laterality Date  . CORONARY STENT INTERVENTION N/A 08/24/2019   Procedure: CORONARY STENT INTERVENTION;  Surgeon: KeTroy SineMD;  [facility];  Service: Cardiovascular;  Laterality: N/A;  . LEFT HEART CATH AND CORONARY ANGIOGRAPHY N/A 08/24/2019   Procedure: LEFT HEART CATH AND CORONARY ANGIOGRAPHY;  Surgeon: KeTroy SineMD;  [facility];  Service: Cardiovascular;   Laterality: N/A;     No Known Allergies    Family History  Problem Relation Age of Onset  . Diabetes Mother   . Hypertension Mother      Social History Ms. Middendorf reports that she has been smoking. She has never used smokeless tobacco. Ms. PaKitteleports no history of alcohol use.   Review of Systems CONSTITUTIONAL: No weight loss, fever, chills, weakness or fatigue.  HEENT: Eyes: No visual loss, blurred vision, double vision or yellow sclerae.No hearing loss, sneezing, congestion, runny nose or sore throat.  SKIN: No rash or itching.  CARDIOVASCULAR: per hpi RESPIRATORY: No shortness of breath, cough or sputum.  GASTROINTESTINAL: No anorexia, nausea, vomiting or diarrhea. No abdominal pain or blood.  GENITOURINARY: No burning on urination, no polyuria NEUROLOGICAL: No headache, dizziness, syncope, paralysis, ataxia, numbness or tingling in the extremities. No change in bowel or bladder control.  MUSCULOSKELETAL: No muscle, back pain, joint pain or stiffness.  LYMPHATICS: No enlarged nodes. No history of splenectomy.  PSYCHIATRIC: No history of depression or anxiety.  ENDOCRINOLOGIC: No reports of sweating, cold or heat intolerance. No polyuria or polydipsia.  . Marland Kitchen Physical Examination Today's Vitals   11/30/19 1051  BP: 102/62  Pulse: (!) 44  Weight: 127 lb (57.6 kg)  Height: 5' 7" (1.702 m)   Body mass index is 19.89 kg/m.  Gen: resting comfortably, no acute distress HEENT: no scleral icterus, pupils equal round and reactive, no palptable cervical adenopathy,  CV: brady, regular, no m/r/g, no jvd Resp: Clear to auscultation bilaterally GI: abdomen is soft, non-tender, non-distended, normal bowel sounds, no hepatosplenomegaly MSK: extremities are warm, no edema.  Skin: warm, no rash Neuro:  no focal deficits Psych: appropriate affect   Diagnostic Studies  Jan 2021 echo 1. Left ventricular ejection fraction, by visual estimation, is 60 to  65%. The left  ventricle has normal function. There is no left ventricular  hypertrophy.  2. The left ventricle has no regional wall motion abnormalities.  3. Global right ventricle has normal systolic function.The right  ventricular size is normal. No increase in right ventricular wall  thickness.  4. Left atrial size was normal.  5. Right atrial size was normal.  6. The mitral valve is normal in structure. No evidence of mitral valve  regurgitation. No evidence of mitral stenosis.  7. The tricuspid valve is normal in structure.  8. The tricuspid valve is normal in structure. Tricuspid valve  regurgitation is not demonstrated.  9. The aortic valve is tricuspid. Aortic valve regurgitation is not  visualized. No evidence of aortic valve sclerosis or stenosis.  10. The pulmonic valve was not well visualized. Pulmonic valve  regurgitation is not visualized.  11. The inferior vena cava is normal in size with greater than 50%  respiratory variability, suggesting right atrial pressure of 3 mmHg.    Feb 2021 cath  Dist RCA lesion is 95% stenosed.  Prox RCA to Mid RCA lesion is 70% stenosed.  1st Mrg lesion is 30% stenosed.  Ost LAD to Mid LAD lesion is 35% stenosed.  3rd Diag lesion is 70% stenosed.  Mid LAD lesion is 70% stenosed.  Post intervention, there is a 0% residual stenosis.  Post intervention, there is a 0% residual stenosis.  Ost RCA to Prox RCA lesion is 20% stenosed.  A stent was successfully placed.  A stent was successfully placed.   Multi-vessel CAD with diffuse 30 to 35% stenoses in the proximal LAD prior to the first diagonal vessel with 70% focal stenosis immediately after the first diagonal vessel and diffuse 70% ostial proximal stenosis in the second diagonal vessel; 30% circumflex marginal-1 stenosis; dominant RCA with mild ostial tapering with 70% diffuse mid stenosis and 95% focal stenosis just proximal to the PDA takeoff distally.  LVEDP 7  mmHg.  Successful PCI to the RCA with PTCA and ultimate insertion of a 2.0 x 12 mm Resolute Onyx DES stent postdilated 2.25 mm in the distal RCA immediately proximal to the PDA takeoff and PCI/DES stenting of the mid RCA with insertion of a 2.5 x 30 mm Resolute Onyx DES stent postdilated to 2.75 mm meters with the stenosis being reduced to 0%.  Post cath RECOMMENDATION: DAPT for minimum of 1 year.  Aggressive lipid-lowering therapy target LDL 60 or below.  Initial medical therapy for concomitant CAD involving the LAD and circumflex marginal vessel.  Smoking cessation is imperative.   Assessment and Plan  1. CAD - no symptoms, continue DAPT at least until Feb 2022 - HRs in 40s today by EKG asymptomatic, will have to d/c lopressor, continue other meds  2. Hyperliidemia - follow repeat labs, had mild LFT elevation on atorva 80, lowered to 8m daily.    JArnoldo Lenis M.D.

## 2019-11-30 NOTE — Addendum Note (Signed)
Addended by: Burman Nieves T on: 11/30/2019 01:05 PM   Modules accepted: Orders

## 2019-11-30 NOTE — Patient Instructions (Addendum)
Your physician wants you to follow-up in: 6 MONTHS WITH DR [facility] You will receive a reminder letter in the mail two months in advance. If you don't receive a letter, please call our office to schedule the follow-up appointment.  Your physician has recommended you make the following change in your medication:   Stop LOPRESSOR    Thank you for choosing [facility]!!

## 2019-12-03 LAB — HEPATIC FUNCTION PANEL
AG Ratio: 1.7 (calc) (ref 1.0–2.5)
ALT: 50 U/L — ABNORMAL HIGH (ref 6–29)
AST: 33 U/L (ref 10–35)
Albumin: 4.4 g/dL (ref 3.6–5.1)
Alkaline phosphatase (APISO): 132 U/L (ref 37–153)
Bilirubin, Direct: 0.2 mg/dL (ref 0.0–0.2)
Globulin: 2.6 g/dL (calc) (ref 1.9–3.7)
Indirect Bilirubin: 0.5 mg/dL (calc) (ref 0.2–1.2)
Total Bilirubin: 0.7 mg/dL (ref 0.2–1.2)
Total Protein: 7 g/dL (ref 6.1–8.1)

## 2019-12-03 LAB — LIPID PANEL
Cholesterol: 141 mg/dL (ref <200)
HDL: 49 mg/dL — ABNORMAL LOW (ref >=50)
LDL Cholesterol (Calc): 73 mg/dL (calc)
Non-HDL Cholesterol (Calc): 92 mg/dL (calc) (ref <130)
Total CHOL/HDL Ratio: 2.9 (calc) (ref <5.0)
Triglycerides: 104 mg/dL (ref <150)

## 2019-12-11 ENCOUNTER — Other Ambulatory Visit (HOSPITAL_COMMUNITY): Payer: Self-pay | Admitting: Internal Medicine

## 2019-12-11 DIAGNOSIS — Z1231 Encounter for screening mammogram for malignant neoplasm of breast: Secondary | ICD-10-CM

## 2020-05-04 ENCOUNTER — Other Ambulatory Visit: Payer: Self-pay

## 2020-05-04 ENCOUNTER — Ambulatory Visit (HOSPITAL_COMMUNITY)
Admission: RE | Admit: 2020-05-04 | Discharge: 2020-05-04 | Disposition: A | Discharge status: Home / Self Care | Payer: Medicare Other | Source: Ambulatory Visit | Attending: Internal Medicine | Admitting: Internal Medicine

## 2020-05-04 DIAGNOSIS — Z1231 Encounter for screening mammogram for malignant neoplasm of breast: Secondary | ICD-10-CM | POA: Diagnosis not present

## 2020-05-09 ENCOUNTER — Ambulatory Visit: Payer: Medicare Other | Admitting: Cardiology

## 2020-05-13 ENCOUNTER — Other Ambulatory Visit (HOSPITAL_COMMUNITY): Payer: Self-pay | Admitting: Internal Medicine

## 2020-05-13 DIAGNOSIS — R928 Other abnormal and inconclusive findings on diagnostic imaging of breast: Secondary | ICD-10-CM

## 2020-05-19 ENCOUNTER — Other Ambulatory Visit (HOSPITAL_COMMUNITY): Payer: Self-pay | Admitting: Internal Medicine

## 2020-05-19 DIAGNOSIS — Z13820 Encounter for screening for osteoporosis: Secondary | ICD-10-CM

## 2020-05-24 ENCOUNTER — Encounter: Payer: Self-pay | Admitting: Cardiology

## 2020-05-24 ENCOUNTER — Ambulatory Visit (INDEPENDENT_AMBULATORY_CARE_PROVIDER_SITE_OTHER): Payer: Medicare Other | Admitting: Cardiology

## 2020-05-24 ENCOUNTER — Other Ambulatory Visit: Payer: Self-pay

## 2020-05-24 VITALS — BP 126/62 | HR 60 | Ht 67.0 in | Wt 125.0 lb

## 2020-05-24 DIAGNOSIS — E782 Mixed hyperlipidemia: Secondary | ICD-10-CM | POA: Diagnosis not present

## 2020-05-24 DIAGNOSIS — I2510 Atherosclerotic heart disease of native coronary artery without angina pectoris: Secondary | ICD-10-CM

## 2020-05-24 DIAGNOSIS — Z79899 Other long term (current) drug therapy: Secondary | ICD-10-CM

## 2020-05-24 DIAGNOSIS — R0989 Other specified symptoms and signs involving the circulatory and respiratory systems: Secondary | ICD-10-CM | POA: Diagnosis not present

## 2020-05-24 MED ORDER — LISINOPRIL 2.5 MG PO TABS: 2.5000 mg | ORAL_TABLET | Freq: Every day | ORAL | 3 refills | Status: DC

## 2020-05-24 NOTE — Patient Instructions (Signed)
.  Medication Instructions:  START Lisinopril 2.5 mg daily  *If you need a refill on your cardiac medications before your next appointment, please call your pharmacy*   Lab Work: BMET in 2 weeks (11/16)  If you have labs (blood work) drawn today and your tests are completely normal, you will receive your results only by: Marland Kitchen MyChart Message (if you have MyChart) OR . A paper copy in the mail If you have any lab test that is abnormal or we need to change your treatment, we will call you to review the results.   Testing/Procedures: Your physician has requested that you have a carotid duplex. This test is an ultrasound of the carotid arteries in your neck. It looks at blood flow through these arteries that supply the brain with blood. Allow one hour for this exam. There are no restrictions or special instructions.     Follow-Up: At [facility], you and your health needs are our priority.  As part of our continuing mission to provide you with exceptional heart care, we have created designated Provider Care Teams.  These Care Teams include your primary Cardiologist (physician) and Advanced Practice Providers (APPs -  Physician Assistants and Nurse Practitioners) who all work together to provide you with the care you need, when you need it.  We recommend signing up for the patient portal called "MyChart".  Sign up information is provided on this After Visit Summary.  MyChart is used to connect with patients for Virtual Visits (Telemedicine).  Patients are able to view lab/test results, encounter notes, upcoming appointments, etc.  Non-urgent messages can be sent to your provider as well.   To learn more about what you can do with MyChart, go to [email or web address].    Your next appointment:   3 month(s)  The format for your next appointment:   In Person  Provider:   Dina Rich, MD   Other Instructions None      Thank you for choosing [facility]  !

## 2020-05-24 NOTE — Progress Notes (Signed)
Clinical Summary Ms. Silverstein is a 64 y.o.female  seen today for follow up of the following medical problems.   1. CAD - admission 08/2019 with NSTEMI - peak hstrop 1300 - cath as reported below, received DES x2 to RCA, residual LAD/diag disease treated medically - Jan 2021 echo 60-65%, no WMAs,  - diffiuclty affording brillinta, changed to plavix - lopressor stopped due to bradycardia    -no chest pain, no SOB/DOE - compliant with meds.    2. Hyperlipidemia - started atorva 73m dur ing recent admission. LDL at that time was 171 - repeat LDL down to 63 - atorva was lowered to 453mdaily due to elevation of LFTs (AST 98 ALT 170 alk phos 224)   - 04/2020 LFTs normal - 04/2020 TC 140 TG 77 HDL 51 LDL 74  3. Tobacco - working to quit   Has not had covid vaccine Past Medical History:  Diagnosis Date  . CAD (coronary artery disease)    a. s/p NSTEMI in 07/2019 with DES to mid-RCA and DES to distal-RCA.   . Marland Kitchenyperlipidemia LDL goal <70   . Tobacco use      No Known Allergies   Current Outpatient Medications  Medication Sig Dispense Refill  . aspirin EC 81 MG EC tablet Take 1 tablet (81 mg total) by mouth daily. 30 tablet 11  . atorvastatin (LIPITOR) 40 MG tablet Take 1 tablet (40 mg total) by mouth daily. 90 tablet 3  . clopidogrel (PLAVIX) 75 MG tablet Take 300 mg (4 tablets) on day one, then take 75 mg (1 tablet) daily. 94 tablet 3   No current facility-administered medications for this visit.     Past Surgical History:  Procedure Laterality Date  . CORONARY STENT INTERVENTION N/A 08/24/2019   Procedure: CORONARY STENT INTERVENTION;  Surgeon: KeTroy SineMD;  [facility];  Service: Cardiovascular;  Laterality: N/A;  . LEFT HEART CATH AND CORONARY ANGIOGRAPHY N/A 08/24/2019   Procedure: LEFT HEART CATH AND CORONARY ANGIOGRAPHY;  Surgeon: KeTroy SineMD;  [facility];  Service: Cardiovascular;  Laterality: N/A;      No Known Allergies    Family History  Problem Relation Age of Onset  . Diabetes Mother   . Hypertension Mother      Social History Ms. Duva reports that she has been smoking. She has never used smokeless tobacco. Ms. PaDazaeports no history of alcohol use.   Review of Systems CONSTITUTIONAL: No weight loss, fever, chills, weakness or fatigue.  HEENT: Eyes: No visual loss, blurred vision, double vision or yellow sclerae.No hearing loss, sneezing, congestion, runny nose or sore throat.  SKIN: No rash or itching.  CARDIOVASCULAR: per hpi RESPIRATORY: No shortness of breath, cough or sputum.  GASTROINTESTINAL: No anorexia, nausea, vomiting or diarrhea. No abdominal pain or blood.  GENITOURINARY: No burning on urination, no polyuria NEUROLOGICAL: No headache, dizziness, syncope, paralysis, ataxia, numbness or tingling in the extremities. No change in bowel or bladder control.  MUSCULOSKELETAL: No muscle, back pain, joint pain or stiffness.  LYMPHATICS: No enlarged nodes. No history of splenectomy.  PSYCHIATRIC: No history of depression or anxiety.  ENDOCRINOLOGIC: No reports of sweating, cold or heat intolerance. No polyuria or polydipsia.  . Marland Kitchen Physical Examination Today's Vitals   05/24/20 0823  BP: 126/62  Pulse: 60  SpO2: 99%  Weight: 125 lb (56.7 kg)  Height: '5\' 7"'  (1.702 m)   Body mass index is 19.58  kg/m.  Gen: resting comfortably, no acute distress HEENT: no scleral icterus, pupils equal round and reactive, no palptable cervical adenopathy,  CV: RRR, no /mr/g, no jvd. +bilateral carotid bruits Resp: Clear to auscultation bilaterally GI: abdomen is soft, non-tender, non-distended, normal bowel sounds, no hepatosplenomegaly MSK: extremities are warm, no edema.  Skin: warm, no rash Neuro:  no focal deficits Psych: appropriate affect   Diagnostic Studies Jan 2021 echo 1. Left ventricular ejection fraction, by visual estimation, is 60 to  65%. The  left ventricle has normal function. There is no left ventricular  hypertrophy.  2. The left ventricle has no regional wall motion abnormalities.  3. Global right ventricle has normal systolic function.The right  ventricular size is normal. No increase in right ventricular wall  thickness.  4. Left atrial size was normal.  5. Right atrial size was normal.  6. The mitral valve is normal in structure. No evidence of mitral valve  regurgitation. No evidence of mitral stenosis.  7. The tricuspid valve is normal in structure.  8. The tricuspid valve is normal in structure. Tricuspid valve  regurgitation is not demonstrated.  9. The aortic valve is tricuspid. Aortic valve regurgitation is not  visualized. No evidence of aortic valve sclerosis or stenosis.  10. The pulmonic valve was not well visualized. Pulmonic valve  regurgitation is not visualized.  11. The inferior vena cava is normal in size with greater than 50%  respiratory variability, suggesting right atrial pressure of 3 mmHg.    Feb 2021 cath  Dist RCA lesion is 95% stenosed.  Prox RCA to Mid RCA lesion is 70% stenosed.  1st Mrg lesion is 30% stenosed.  Ost LAD to Mid LAD lesion is 35% stenosed.  3rd Diag lesion is 70% stenosed.  Mid LAD lesion is 70% stenosed.  Post intervention, there is a 0% residual stenosis.  Post intervention, there is a 0% residual stenosis.  Ost RCA to Prox RCA lesion is 20% stenosed.  A stent was successfully placed.  A stent was successfully placed.  Multi-vessel CAD with diffuse 30 to 35% stenoses in the proximal LAD prior to the first diagonal vessel with 70% focal stenosis immediately after the first diagonal vessel and diffuse 70% ostial proximal stenosis in the second diagonal vessel; 30% circumflex marginal-1 stenosis; dominant RCA with mild ostial tapering with 70% diffuse mid stenosis and 95% focal stenosis just proximal to the PDA takeoff distally.  LVEDP 7  mmHg.  Successful PCI to the RCA with PTCA and ultimate insertion of a 2.0 x 12 mm Resolute Onyx DES stent postdilated 2.25 mm in the distal RCA immediately proximal to the PDA takeoff and PCI/DES stenting of the mid RCA with insertion of a 2.5 x 30 mm Resolute Onyx DES stent postdilated to 2.75 mm meters with the stenosis being reduced to 0%.  Post cath RECOMMENDATION: DAPT for minimum of 1 year. Aggressive lipid-lowering therapy target LDL 60 or below. Initial medical therapy for concomitant CAD involving the LAD and circumflex marginal vessel. Smoking cessation is imperative.    Assessment and Plan  1. CAD - no symptoms - off beta blocker due to bradycardia - ACE/ARB not started previously due to low bp's, with bp's today I think will tolerate lisinopril 2.34m daily. Check BMET in 2 weeks  2. Hyperliidemia - essentially at goal, continue statin - had some LFT elevation with higher dose atorva. If needed would either change to crestor in the future or add zetia.   3. Carotid bruits -  order carotid US     Arnoldo Lenis, M.D.

## 2020-05-27 ENCOUNTER — Inpatient Hospital Stay (HOSPITAL_COMMUNITY): Admission: RE | Admit: 2020-05-27 | Payer: Medicare Other | Source: Ambulatory Visit

## 2020-05-27 ENCOUNTER — Ambulatory Visit (HOSPITAL_COMMUNITY): Payer: Medicare Other

## 2020-05-30 ENCOUNTER — Ambulatory Visit (HOSPITAL_COMMUNITY): Payer: Medicare Other

## 2020-05-31 ENCOUNTER — Ambulatory Visit (HOSPITAL_COMMUNITY)
Admission: RE | Admit: 2020-05-31 | Discharge: 2020-05-31 | Disposition: A | Discharge status: Home / Self Care | Payer: Medicare Other | Source: Ambulatory Visit | Attending: Internal Medicine | Admitting: Internal Medicine

## 2020-05-31 ENCOUNTER — Other Ambulatory Visit: Payer: Self-pay

## 2020-05-31 ENCOUNTER — Ambulatory Visit (HOSPITAL_COMMUNITY)
Admission: RE | Admit: 2020-05-31 | Discharge: 2020-05-31 | Disposition: A | Discharge status: Home / Self Care | Payer: Medicare Other | Source: Ambulatory Visit | Attending: Cardiology | Admitting: Cardiology

## 2020-05-31 DIAGNOSIS — R0989 Other specified symptoms and signs involving the circulatory and respiratory systems: Secondary | ICD-10-CM

## 2020-05-31 DIAGNOSIS — R928 Other abnormal and inconclusive findings on diagnostic imaging of breast: Secondary | ICD-10-CM | POA: Diagnosis present

## 2020-05-31 DIAGNOSIS — Z13820 Encounter for screening for osteoporosis: Secondary | ICD-10-CM | POA: Insufficient documentation

## 2020-06-07 ENCOUNTER — Other Ambulatory Visit: Payer: Self-pay

## 2020-06-07 ENCOUNTER — Other Ambulatory Visit (HOSPITAL_COMMUNITY)
Admission: RE | Admit: 2020-06-07 | Discharge: 2020-06-07 | Disposition: A | Discharge status: Home / Self Care | Payer: Medicare Other | Source: Ambulatory Visit | Attending: Cardiology | Admitting: Cardiology

## 2020-06-07 DIAGNOSIS — Z79899 Other long term (current) drug therapy: Secondary | ICD-10-CM | POA: Diagnosis present

## 2020-06-07 LAB — BASIC METABOLIC PANEL
Anion gap: 7 (ref 5–15)
BUN: 11 mg/dL (ref 8–23)
CO2: 28 mmol/L (ref 22–32)
Calcium: 9.5 mg/dL (ref 8.9–10.3)
Chloride: 105 mmol/L (ref 98–111)
Creatinine, Ser: 0.71 mg/dL (ref 0.44–1.00)
GFR, Estimated: >60 mL/min (ref >60)
Glucose, Bld: 92 mg/dL (ref 70–99)
Potassium: 4.3 mmol/L (ref 3.5–5.1)
Sodium: 140 mmol/L (ref 135–145)

## 2020-06-09 ENCOUNTER — Telehealth: Payer: Self-pay | Admitting: *Deleted

## 2020-06-09 NOTE — Telephone Encounter (Signed)
Pt voiced understanding

## 2020-06-09 NOTE — Telephone Encounter (Signed)
-----   Message from Antoine Poche, MD sent at 06/06/2020  8:53 AM EST ----- Carotid US shows just some moderate plaque in the arteries, no severe blockages. Just something to monitor at this time. The same meds he is on for his heart to protect those arteries also help to protect the arteries in the neck   J BrancH MD

## 2020-06-10 ENCOUNTER — Other Ambulatory Visit: Payer: Self-pay | Admitting: Internal Medicine

## 2020-06-10 DIAGNOSIS — R928 Other abnormal and inconclusive findings on diagnostic imaging of breast: Secondary | ICD-10-CM

## 2020-06-15 ENCOUNTER — Telehealth: Payer: Self-pay | Admitting: *Deleted

## 2020-06-15 NOTE — Telephone Encounter (Signed)
-----   Message from Antoine Poche, MD sent at 06/14/2020  1:35 PM EST ----- Normal labs  Dominga Ferry MD

## 2020-06-15 NOTE — Telephone Encounter (Signed)
Pt aware.

## 2020-06-24 ENCOUNTER — Ambulatory Visit
Admission: RE | Admit: 2020-06-24 | Discharge: 2020-06-24 | Disposition: A | Discharge status: Home / Self Care | Payer: Medicare Other | Source: Ambulatory Visit | Attending: Internal Medicine | Admitting: Internal Medicine

## 2020-06-24 ENCOUNTER — Ambulatory Visit: Payer: Medicare Other

## 2020-06-24 ENCOUNTER — Other Ambulatory Visit: Payer: Self-pay

## 2020-06-24 DIAGNOSIS — R928 Other abnormal and inconclusive findings on diagnostic imaging of breast: Secondary | ICD-10-CM

## 2020-07-01 ENCOUNTER — Other Ambulatory Visit: Payer: Self-pay

## 2020-07-01 ENCOUNTER — Ambulatory Visit
Admission: RE | Admit: 2020-07-01 | Discharge: 2020-07-01 | Disposition: A | Discharge status: Home / Self Care | Payer: Medicare Other | Source: Ambulatory Visit | Attending: Internal Medicine | Admitting: Internal Medicine

## 2020-07-01 DIAGNOSIS — R928 Other abnormal and inconclusive findings on diagnostic imaging of breast: Secondary | ICD-10-CM

## 2020-08-17 NOTE — Progress Notes (Signed)
Cardiology Office Note    Date:  08/30/2020   ID:  BRIGITTA Perry, DOB Jul 31, 1955, MRN [id number]  PCP:  Jill Stabile, MD  Cardiologist: Jill Rich, MD EPS: None  No chief complaint on file.   History of Present Illness:  Jill Perry is a 65 y.o. female with history of CAD status post NSTEMI 07/2019 treated with DES x2 to the RCA with residual LAD and diagonal disease treated medically.  Echo 07/2019 LVEF 60 to 65% no wall motion abnormalities.  Could not afford Brilinta so changed to Plavix.  Beta-blocker stopped due to bradycardia.  Patient also has hyperlipidemia on atorvastatin lowered because of elevated LFTs.  Also has tobacco abuse.  Patient last saw Dr. Wyline Mood 05/24/2020 at which time he thought her blood pressure could tolerate low-dose lisinopril 2.5 mg daily started.  She also had carotid bruit so Dopplers were ordered and showed 50 to 69% R ICA and no significant stenosis on the left.  Patient comes in for regular f/u. She says she's doing well. Stays busy all day long. Wal[place] around [place] a lot. Denies chest pain, dyspnea, dizziness or edema. Smoking less then 1/2 ppd and says she'll quit.    Past Medical History:  Diagnosis Date  . CAD (coronary artery disease)    a. s/p NSTEMI in 07/2019 with DES to mid-RCA and DES to distal-RCA.   Marland Kitchen Hyperlipidemia LDL goal <70   . Tobacco use     Past Surgical History:  Procedure Laterality Date  . CORONARY STENT INTERVENTION N/A 08/24/2019   Procedure: CORONARY STENT INTERVENTION;  Surgeon: Jill Bihari, MD;  Location: [facility] IN[facility]ASIVE [facility];  Service: Cardiovascular;  Laterality: N/A;  . LEFT HEART CATH AND CORONARY ANGIOGRAPHY N/A 08/24/2019   Procedure: LEFT HEART CATH AND CORONARY ANGIOGRAPHY;  Surgeon: Jill Bihari, MD;  [facility]: [facility];  Service: Cardiovascular;  Laterality: N/A;    Current Medications: Current Meds  Medication Sig  . aspirin EC 81 MG EC tablet Take 1 tablet (81 mg total) by  mouth daily.  Marland Kitchen atorvastatin (LIPITOR) 40 MG tablet Take 1 tablet (40 mg total) by mouth daily.  Marland Kitchen lisinopril (ZESTRIL) 2.5 MG tablet Take 1 tablet (2.5 mg total) by mouth daily.  . [DISCONTINUED] clopidogrel (PLAVIX) 75 MG tablet Take 300 mg (4 tablets) on day one, then take 75 mg (1 tablet) daily.     Allergies:   Patient has no known allergies.   Social History   Socioeconomic History  . Marital status: Married    Spouse name: Not on file  . Number of children: Not on file  . Years of education: Not on file  . Highest education level: Not on file  Occupational History  . Not on file  Tobacco Use  . Smoking status: Current Every Day Smoker  . Smokeless tobacco: Never Used  Vaping Use  . Vaping Use: Never used  Substance and Sexual Activity  . Alcohol use: Never  . Drug use: Never  . Sexual activity: Not Currently  Other Topics Concern  . Not on file  Social History Narrative  . Not on file   Social Determinants of Health   Financial Resource Strain: Not on file  Food Insecurity: Not on file  Transportation Needs: Not on file  Physical Activity: Not on file  Stress: Not on file  Social Connections: Not on file     Family History:  The patient's family history includes Diabetes in  her mother; Hypertension in her mother.   ROS:   Please see the history of present illness.    ROS All other systems reviewed and are negative.   PHYSICAL EXAM:   VS:  BP 132/78   Pulse 60   Ht 5\' 7"  (1.702 m)   Wt 123 lb 6.4 oz (56 kg)   SpO2 100%   BMI 19.33 kg/m   Physical Exam  GEN: Thin, in no acute distress  Neck: Right carotid bruit,no JVD,  or masses Cardiac:RRR; no murmurs, rubs, or gallops  Respiratory:  clear to auscultation bilaterally, normal work of breathing GI: soft, nontender, nondistended, + BS Ext: without cyanosis, clubbing, or edema, Good distal pulses bilaterally Neuro:  Alert and Oriented x 3, Strength and sensation are intact Psych: euthymic mood, full  affect  Wt Readings from Last 3 Encounters:  08/30/20 123 lb 6.4 oz (56 kg)  05/24/20 125 lb (56.7 kg)  11/30/19 127 lb (57.6 kg)      Studies/Labs Reviewed:   EKG:  EKG is not ordered today.    Recent Labs: 12/03/2019: ALT 50 08/19/2020: BUN 11; Creatinine, Ser 0.62; Potassium 4.1; Sodium 140   Lipid Panel    Component Value Date/Time   CHOL 141 12/03/2019 0803   TRIG 104 12/03/2019 0803   HDL 49 (L) 12/03/2019 0803   CHOLHDL 2.9 12/03/2019 0803   VLDL 18 08/24/2019 0719   LDLCALC 73 12/03/2019 0803    Additional studies/ records that were reviewed today include:  Carotid Dopplers 05/31/2020 IMPRESSION: Right:   Heterogeneous and partially calcified plaque at the right carotid bifurcation contributes to 50%-69% stenosis by established duplex criteria.   Left:   Color duplex indicates moderate heterogeneous and calcified plaque, with no hemodynamically significant stenosis by duplex criteria in the extracranial cerebrovascular circulation.   Developing right subclavian artery stenosis which contributes to abnormal right vertebral artery waveform.   Signed,   13/03/2020. Jill Perry, RPVI   [facility]   [facility]     Electronically Signed   By: [facility] D.O.   On: 05/31/2020 10:54    Risk Assessment/Calculations:         ASSESSMENT:    1. Coronary artery disease involving native coronary artery of native heart without angina pectoris   2. Other hyperlipidemia   3. Bilateral carotid bruits   4. Tobacco abuse      PLAN:  In order of problems listed above:  CAD status post NSTEMI 07/2019 treated with DES x2 to the RCA with residual LAD and diagonal disease treated medically.  Echo 07/2019 LVEF 60 to 65% no wall motion abnormalities.  Could not afford Brilinta so changed to Plavix.  Beta-blocker stopped due to bradycardia.  Low-dose lisinopril added last office visit. Doing well, no angina, BP  controlled.  Hyperlipidemia had increased LFTs on higher dose atorvastatin-PCP following and will check in May  Carotid stenosis 50 to 69% R ICA on Dopplers 05/31/2020-smoking cessation discussed  Tobacco abuse smoking cessation discussed. She has a coupon for nicoderm patch and says she'll try it.    Shared Decision Making/Informed Consent        Medication Adjustments/Labs and Tests Ordered: Current medicines are reviewed at length with the patient today.  Concerns regarding medicines are outlined above.  Medication changes, Labs and Tests ordered today are listed in the Patient Instructions below. Patient Instructions   Medication Instructions:  Your physician recommends that you continue on your current medications as  directed. Please refer to the Current Medication list given to you today.  *If you need a refill on your cardiac medications before your next appointment, please call your pharmacy*   Lab Work: NONE   If you have labs (blood work) drawn today and your tests are completely normal, you will receive your results only by: Marland Kitchen MyChart Message (if you have MyChart) OR . A paper copy in the mail If you have any lab test that is abnormal or we need to change your treatment, we will call you to review the results.   Testing/Procedures: NONE    Follow-Up: At [facility], you and your health needs are our priority.  As part of our continuing mission to provide you with exceptional heart care, we have created designated Provider Care Teams.  These Care Teams include your primary Cardiologist (physician) and Advanced Practice Providers (APPs -  Physician Assistants and Nurse Practitioners) who all work together to provide you with the care you need, when you need it.  We recommend signing up for the patient portal called "MyChart".  Sign up information is provided on this After Visit Summary.  MyChart is used to connect with patients for Virtual Visits (Telemedicine).   Patients are able to view lab/test results, encounter notes, upcoming appointments, etc.  Non-urgent messages can be sent to your provider as well.   To learn more about what you can do with MyChart, go to [email or web address].    Your next appointment:   6 month(s)  The format for your next appointment:   In Person  Provider:   Dina Rich, MD   Other Instructions Thank you for choosing  HeartCare!  Your provider would like you to have 150 minutes of exercise weekly.    Steps to Quit Smoking Smoking tobacco is the leading cause of preventable death. It can affect almost every organ in the body. Smoking puts you and those around you at risk for developing many serious chronic diseases. Quitting smoking can be difficult, but it is one of the best things that you can do for your health. It is never too late to quit. How do I get ready to quit? When you decide to quit smoking, create a plan to help you succeed. Before you quit:  Pick a date to quit. Set a date within the next 2 weeks to give you time to prepare.  Write down the reasons why you are quitting. Keep this list in places where you will see it often.  Tell your family, friends, and co-workers that you are quitting. Support from your loved ones can make quitting easier.  Talk with your health care provider about your options for quitting smoking.  Find out what treatment options are covered by your health insurance.  Identify people, places, things, and activities that make you want to smoke (triggers). Avoid them. What first steps can I take to quit smoking?  Throw away all cigarettes at home, at work, and in your car.  Throw away smoking accessories, such as Set designer.  Clean your car. Make sure to empty the ashtray.  Clean your home, including curtains and carpets. What strategies can I use to quit smoking? Talk with your health care provider about combining strategies, such as  taking medicines while you are also receiving in-person counseling. Using these two strategies together makes you more likely to succeed in quitting than if you used either strategy on its own.  If you are pregnant or breastfeeding, talk  with your health care provider about finding counseling or other support strategies to quit smoking. Do not take medicine to help you quit smoking unless your health care provider tells you to do so. To quit smoking: Quit right away  Quit smoking completely, instead of gradually reducing how much you smoke over a period of time. Research shows that stopping smoking right away is more successful than gradually quitting.  Attend in-person counseling to help you build problem-solving skills. You are more likely to succeed in quitting if you attend counseling sessions regularly. Even short sessions of 10 minutes can be effective. Take medicine You may take medicines to help you quit smoking. Some medicines require a prescription and some you can purchase over-the-counter. Medicines may have nicotine in them to replace the nicotine in cigarettes. Medicines may:  Help to stop cravings.  Help to relieve withdrawal symptoms. Your health care provider may recommend:  Nicotine patches, gum, or lozenges.  Nicotine inhalers or sprays.  Non-nicotine medicine that is taken by mouth. Find resources Find resources and support systems that can help you to quit smoking and remain smoke-free after you quit. These resources are most helpful when you use them often. They include:  Online chats with a Veterinary surgeoncounselor.  Telephone quitlines.  Printed Materials engineerself-help materials.  Support groups or group counseling.  Text messaging programs.  Mobile phone apps or applications. Use apps that can help you stick to your quit plan by providing reminders, tips, and encouragement. There are many free apps for mobile devices as well as websites. Examples include Quit Guide from the [employer] and  smokefree.gov   What things can I do to make it easier to quit?  Reach out to your family and friends for support and encouragement. Call telephone quitlines (1-800-QUIT-NOW), reach out to support groups, or work with a counselor for support.  Ask people who smoke to avoid smoking around you.  Avoid places that trigger you to smoke, such as bars, parties, or smoke-break areas at work.  Spend time with people who do not smoke.  Lessen the stress in your life. Stress can be a smoking trigger for some people. To lessen stress, try: ? Exercising regularly. ? Doing deep-breathing exercises. ? Doing yoga. ? Meditating. ? Performing a body scan. This involves closing your eyes, scanning your body from head to toe, and noticing which parts of your body are particularly tense. Try to relax the muscles in those areas.   How will I feel when I quit smoking? Day 1 to 3 weeks Within the first 24 hours of quitting smoking, you may start to feel withdrawal symptoms. These symptoms are usually most noticeable 2-3 days after quitting, but they usually do not last for more than 2-3 weeks. You may experience these symptoms:  Mood swings.  Restlessness, anxiety, or irritability.  Trouble concentrating.  Dizziness.  Strong cravings for sugary foods and nicotine.  Mild weight gain.  Constipation.  Nausea.  Coughing or a sore throat.  Changes in how the medicines that you take for unrelated issues work in your body.  Depression.  Trouble sleeping (insomnia). Week 3 and afterward After the first 2-3 weeks of quitting, you may start to notice more positive results, such as:  Improved sense of smell and taste.  Decreased coughing and sore throat.  Slower heart rate.  Lower blood pressure.  Clearer skin.  The ability to breathe more easily.  Fewer sick days. Quitting smoking can be very challenging. Do not get discouraged if you  are not successful the first time. Some people need to  make many attempts to quit before they achieve long-term success. Do your best to stick to your quit plan, and talk with your health care provider if you have any questions or concerns. Summary  Smoking tobacco is the leading cause of preventable death. Quitting smoking is one of the best things that you can do for your health.  When you decide to quit smoking, create a plan to help you succeed.  Quit smoking right away, not slowly over a period of time.  When you start quitting, seek help from your health care provider, family, or friends. This information is not intended to replace advice given to you by your health care provider. Make sure you discuss any questions you have with your health care provider. Document Revised: 04/03/2019 Document Reviewed: 09/27/2018 Elsevier Patient Education  [address]      Signed, Jacolyn Reedy, [place]  08/30/2020 2:39 PM    [facility] [address] [place], [place], [place]  [address] Phone: [phone]; Fax: [phone]

## 2020-08-19 ENCOUNTER — Telehealth: Payer: Self-pay | Admitting: *Deleted

## 2020-08-19 ENCOUNTER — Other Ambulatory Visit (HOSPITAL_COMMUNITY)
Admission: RE | Admit: 2020-08-19 | Discharge: 2020-08-19 | Disposition: A | Discharge status: Home / Self Care | Payer: Medicare Other | Source: Ambulatory Visit | Attending: Cardiology | Admitting: Cardiology

## 2020-08-19 ENCOUNTER — Other Ambulatory Visit: Payer: Self-pay

## 2020-08-19 DIAGNOSIS — Z79899 Other long term (current) drug therapy: Secondary | ICD-10-CM | POA: Diagnosis present

## 2020-08-19 LAB — BASIC METABOLIC PANEL
Anion gap: 6 (ref 5–15)
BUN: 11 mg/dL (ref 8–23)
CO2: 30 mmol/L (ref 22–32)
Calcium: 9.4 mg/dL (ref 8.9–10.3)
Chloride: 104 mmol/L (ref 98–111)
Creatinine, Ser: 0.62 mg/dL (ref 0.44–1.00)
GFR, Estimated: >60 mL/min (ref >60)
Glucose, Bld: 93 mg/dL (ref 70–99)
Potassium: 4.1 mmol/L (ref 3.5–5.1)
Sodium: 140 mmol/L (ref 135–145)

## 2020-08-19 NOTE — Telephone Encounter (Signed)
Patient informed. Copy sent to PCP °

## 2020-08-19 NOTE — Telephone Encounter (Signed)
-----   Message from Jonathan F Branch, MD sent at 08/19/2020 11:17 AM EST ----- Normal labs  J Branch MD 

## 2020-08-30 ENCOUNTER — Ambulatory Visit (INDEPENDENT_AMBULATORY_CARE_PROVIDER_SITE_OTHER): Payer: Medicare Other | Admitting: Physician Assistant

## 2020-08-30 ENCOUNTER — Other Ambulatory Visit: Payer: Self-pay

## 2020-08-30 ENCOUNTER — Encounter: Payer: Self-pay | Admitting: Physician Assistant

## 2020-08-30 VITALS — BP 132/78 | HR 60 | Ht 67.0 in | Wt 123.4 lb

## 2020-08-30 DIAGNOSIS — E7849 Other hyperlipidemia: Secondary | ICD-10-CM | POA: Diagnosis not present

## 2020-08-30 DIAGNOSIS — I2510 Atherosclerotic heart disease of native coronary artery without angina pectoris: Secondary | ICD-10-CM

## 2020-08-30 DIAGNOSIS — Z720 Tobacco use: Secondary | ICD-10-CM | POA: Diagnosis not present

## 2020-08-30 DIAGNOSIS — R0989 Other specified symptoms and signs involving the circulatory and respiratory systems: Secondary | ICD-10-CM

## 2020-08-30 MED ORDER — CLOPIDOGREL BISULFATE 75 MG PO TABS: 75.0000 mg | ORAL_TABLET | Freq: Every day | ORAL | 3 refills | Status: DC

## 2020-08-30 NOTE — Patient Instructions (Signed)
Medication Instructions:  Your physician recommends that you continue on your current medications as directed. Please refer to the Current Medication list given to you today.  *If you need a refill on your cardiac medications before your next appointment, please call your pharmacy*   Lab Work: NONE   If you have labs (blood work) drawn today and your tests are completely normal, you will receive your results only by: Marland Kitchen MyChart Message (if you have MyChart) OR . A paper copy in the mail If you have any lab test that is abnormal or we need to change your treatment, we will call you to review the results.   Testing/Procedures: NONE    Follow-Up: At [facility], you and your health needs are our priority.  As part of our continuing mission to provide you with exceptional heart care, we have created designated Provider Care Teams.  These Care Teams include your primary Cardiologist (physician) and Advanced Practice Providers (APPs -  Physician Assistants and Nurse Practitioners) who all work together to provide you with the care you need, when you need it.  We recommend signing up for the patient portal called "MyChart".  Sign up information is provided on this After Visit Summary.  MyChart is used to connect with patients for Virtual Visits (Telemedicine).  Patients are able to view lab/test results, encounter notes, upcoming appointments, etc.  Non-urgent messages can be sent to your provider as well.   To learn more about what you can do with MyChart, go to [email or web address].    Your next appointment:   6 month(s)  The format for your next appointment:   In Person  Provider:   Dina Rich, MD   Other Instructions Thank you for choosing [facility]!  Your provider would like you to have 150 minutes of exercise weekly.    Steps to Quit Smoking Smoking tobacco is the leading cause of preventable death. It can affect almost every organ in the body. Smoking  puts you and those around you at risk for developing many serious chronic diseases. Quitting smoking can be difficult, but it is one of the best things that you can do for your health. It is never too late to quit. How do I get ready to quit? When you decide to quit smoking, create a plan to help you succeed. Before you quit:  Pick a date to quit. Set a date within the next 2 weeks to give you time to prepare.  Write down the reasons why you are quitting. Keep this list in places where you will see it often.  Tell your family, friends, and co-workers that you are quitting. Support from your loved ones can make quitting easier.  Talk with your health care provider about your options for quitting smoking.  Find out what treatment options are covered by your health insurance.  Identify people, places, things, and activities that make you want to smoke (triggers). Avoid them. What first steps can I take to quit smoking?  Throw away all cigarettes at home, at work, and in your car.  Throw away smoking accessories, such as Set designer.  Clean your car. Make sure to empty the ashtray.  Clean your home, including curtains and carpets. What strategies can I use to quit smoking? Talk with your health care provider about combining strategies, such as taking medicines while you are also receiving in-person counseling. Using these two strategies together makes you more likely to succeed in quitting than if you used  either strategy on its own.  If you are pregnant or breastfeeding, talk with your health care provider about finding counseling or other support strategies to quit smoking. Do not take medicine to help you quit smoking unless your health care provider tells you to do so. To quit smoking: Quit right away  Quit smoking completely, instead of gradually reducing how much you smoke over a period of time. Research shows that stopping smoking right away is more successful than  gradually quitting.  Attend in-person counseling to help you build problem-solving skills. You are more likely to succeed in quitting if you attend counseling sessions regularly. Even short sessions of 10 minutes can be effective. Take medicine You may take medicines to help you quit smoking. Some medicines require a prescription and some you can purchase over-the-counter. Medicines may have nicotine in them to replace the nicotine in cigarettes. Medicines may:  Help to stop cravings.  Help to relieve withdrawal symptoms. Your health care provider may recommend:  Nicotine patches, gum, or lozenges.  Nicotine inhalers or sprays.  Non-nicotine medicine that is taken by mouth. Find resources Find resources and support systems that can help you to quit smoking and remain smoke-free after you quit. These resources are most helpful when you use them often. They include:  Online chats with a Veterinary surgeon.  Telephone quitlines.  Printed Materials engineer.  Support groups or group counseling.  Text messaging programs.  Mobile phone apps or applications. Use apps that can help you stick to your quit plan by providing reminders, tips, and encouragement. There are many free apps for mobile devices as well as websites. Examples include Quit Guide from the [employer] and smokefree.gov   What things can I do to make it easier to quit?  Reach out to your family and friends for support and encouragement. Call telephone quitlines (1-800-QUIT-NOW), reach out to support groups, or work with a counselor for support.  Ask people who smoke to avoid smoking around you.  Avoid places that trigger you to smoke, such as bars, parties, or smoke-break areas at work.  Spend time with people who do not smoke.  Lessen the stress in your life. Stress can be a smoking trigger for some people. To lessen stress, try: ? Exercising regularly. ? Doing deep-breathing exercises. ? Doing yoga. ? Meditating. ? Performing  a body scan. This involves closing your eyes, scanning your body from head to toe, and noticing which parts of your body are particularly tense. Try to relax the muscles in those areas.   How will I feel when I quit smoking? Day 1 to 3 weeks Within the first 24 hours of quitting smoking, you may start to feel withdrawal symptoms. These symptoms are usually most noticeable 2-3 days after quitting, but they usually do not last for more than 2-3 weeks. You may experience these symptoms:  Mood swings.  Restlessness, anxiety, or irritability.  Trouble concentrating.  Dizziness.  Strong cravings for sugary foods and nicotine.  Mild weight gain.  Constipation.  Nausea.  Coughing or a sore throat.  Changes in how the medicines that you take for unrelated issues work in your body.  Depression.  Trouble sleeping (insomnia). Week 3 and afterward After the first 2-3 weeks of quitting, you may start to notice more positive results, such as:  Improved sense of smell and taste.  Decreased coughing and sore throat.  Slower heart rate.  Lower blood pressure.  Clearer skin.  The ability to breathe more easily.  Fewer  sick days. Quitting smoking can be very challenging. Do not get discouraged if you are not successful the first time. Some people need to make many attempts to quit before they achieve long-term success. Do your best to stick to your quit plan, and talk with your health care provider if you have any questions or concerns. Summary  Smoking tobacco is the leading cause of preventable death. Quitting smoking is one of the best things that you can do for your health.  When you decide to quit smoking, create a plan to help you succeed.  Quit smoking right away, not slowly over a period of time.  When you start quitting, seek help from your health care provider, family, or friends. This information is not intended to replace advice given to you by your health care provider.  Make sure you discuss any questions you have with your health care provider. Document Revised: 04/03/2019 Document Reviewed: 09/27/2018 Elsevier Patient Education  2021 [employer].

## 2020-10-24 ENCOUNTER — Telehealth: Payer: Self-pay | Admitting: Cardiology

## 2020-10-24 ENCOUNTER — Other Ambulatory Visit: Payer: Self-pay | Admitting: Student

## 2020-10-24 MED ORDER — ATORVASTATIN CALCIUM 40 MG PO TABS: 40.0000 mg | ORAL_TABLET | Freq: Every day | ORAL | 3 refills | Status: DC

## 2020-10-24 NOTE — Telephone Encounter (Signed)
New message     *STAT* If patient is at the pharmacy, call can be transferred to refill team.   1. Which medications need to be refilled? (please list name of each medication and dose if known) atorvastatin (LIPITOR) 40 MG tablet(Expired) 2. Which pharmacy/location (including street and city if local pharmacy) is medication to be sent to? [facility] on [address]   3. Do they need a 30 day or 90 day supply?  90

## 2020-10-24 NOTE — Telephone Encounter (Signed)
Refilled atorvastatin per request 

## 2021-03-01 ENCOUNTER — Encounter: Payer: Self-pay | Admitting: Cardiology

## 2021-03-01 ENCOUNTER — Ambulatory Visit (INDEPENDENT_AMBULATORY_CARE_PROVIDER_SITE_OTHER): Payer: Medicare Other | Admitting: Cardiology

## 2021-03-01 ENCOUNTER — Other Ambulatory Visit: Payer: Self-pay

## 2021-03-01 VITALS — BP 116/60 | HR 56 | Ht 67.0 in | Wt 120.0 lb

## 2021-03-01 DIAGNOSIS — I6523 Occlusion and stenosis of bilateral carotid arteries: Secondary | ICD-10-CM | POA: Diagnosis not present

## 2021-03-01 DIAGNOSIS — R001 Bradycardia, unspecified: Secondary | ICD-10-CM | POA: Diagnosis not present

## 2021-03-01 DIAGNOSIS — E7849 Other hyperlipidemia: Secondary | ICD-10-CM

## 2021-03-01 DIAGNOSIS — I2510 Atherosclerotic heart disease of native coronary artery without angina pectoris: Secondary | ICD-10-CM

## 2021-03-01 NOTE — Progress Notes (Signed)
Clinical Summary Jill Perry is a 65 y.o.female seen today for follow up of the following medical problems.    1. CAD - admission 08/2019 with NSTEMI - peak hstrop 1300 - cath as reported below, received DES x2 to RCA, residual LAD/diag disease treated medically - Jan 2021 echo 60-65%, no WMAs, - diffiuclty affording brillinta, changed to plavix - lopressor stopped due to bradycardia       - no recent chest pains, no SOB/DOE - compliant with meds     2. Hyperlipidemia - started atorva 22m dur ing recent admission. LDL at that time was 171 - repeat LDL down to 63 - atorva was lowered to 471mdaily due to elevation of LFTs (AST 98 ALT 170 alk phos 224)     - 04/2020 LFTs normal - 04/2020 TC 140 TG 77 HDL 51 LDL 74 - upcoming labs with pcp   3. Tobacco - working to quit  4. Carotid stenosis 05/2020 carotid [place] 5040-98%LICA moderate plaque without stenosis Past Medical History:  Diagnosis Date   CAD (coronary artery disease)    a. s/p NSTEMI in 07/2019 with DES to mid-RCA and DES to distal-RCA.    Hyperlipidemia LDL goal <70    Tobacco use      No Known Allergies   Current Outpatient Medications  Medication Sig Dispense Refill   atorvastatin (LIPITOR) 40 MG tablet TAKE 1 TABLET(40 MG) BY MOUTH DAILY 90 tablet 3   aspirin EC 81 MG EC tablet Take 1 tablet (81 mg total) by mouth daily. 30 tablet 11   atorvastatin (LIPITOR) 40 MG tablet Take 1 tablet (40 mg total) by mouth daily. 90 tablet 3   clopidogrel (PLAVIX) 75 MG tablet Take 1 tablet (75 mg total) by mouth daily. 90 tablet 3   lisinopril (ZESTRIL) 2.5 MG tablet Take 1 tablet (2.5 mg total) by mouth daily. 90 tablet 3   No current facility-administered medications for this visit.     Past Surgical History:  Procedure Laterality Date   CORONARY STENT INTERVENTION N/A 08/24/2019   Procedure: CORONARY STENT INTERVENTION;  Surgeon: KeTroy SineMD;  [facility];  Service:  Cardiovascular;  Laterality: N/A;   LEFT HEART CATH AND CORONARY ANGIOGRAPHY N/A 08/24/2019   Procedure: LEFT HEART CATH AND CORONARY ANGIOGRAPHY;  Surgeon: KeTroy SineMD;  [facility];  Service: Cardiovascular;  Laterality: N/A;     No Known Allergies    Family History  Problem Relation Age of Onset   Diabetes Mother    Hypertension Mother      Social History Ms. Plath reports that she has been smoking. She has never used smokeless tobacco. Ms. PaDaceyeports no history of alcohol use.   Review of Systems CONSTITUTIONAL: No weight loss, fever, chills, weakness or fatigue.  HEENT: Eyes: No visual loss, blurred vision, double vision or yellow sclerae.No hearing loss, sneezing, congestion, runny nose or sore throat.  SKIN: No rash or itching.  CARDIOVASCULAR: per hpi RESPIRATORY: No shortness of breath, cough or sputum.  GASTROINTESTINAL: No anorexia, nausea, vomiting or diarrhea. No abdominal pain or blood.  GENITOURINARY: No burning on urination, no polyuria NEUROLOGICAL: No headache, dizziness, syncope, paralysis, ataxia, numbness or tingling in the extremities. No change in bowel or bladder control.  MUSCULOSKELETAL: No muscle, back pain, joint pain or stiffness.  LYMPHATICS: No enlarged nodes. No history of splenectomy.  PSYCHIATRIC: No history of depression or anxiety.  ENDOCRINOLOGIC: No reports  of sweating, cold or heat intolerance. No polyuria or polydipsia.  Marland Kitchen   Physical Examination Today's Vitals   03/01/21 0848  BP: 116/60  Pulse: (!) 56  SpO2: 99%  Weight: 120 lb (54.4 kg)  Height: '5\' 7"'  (1.702 m)   Body mass index is 18.79 kg/m.  Gen: resting comfortably, no acute distress HEENT: no scleral icterus, pupils equal round and reactive, no palptable cervical adenopathy,  CV: regular, brady. No m/r/g no jvd Resp: Clear to auscultation bilaterally GI: abdomen is soft, non-tender, non-distended, normal bowel sounds, no  hepatosplenomegaly MSK: extremities are warm, no edema.  Skin: warm, no rash Neuro:  no focal deficits Psych: appropriate affect   Diagnostic Studies  Jan 2021 echo 1. Left ventricular ejection fraction, by visual estimation, is 60 to  65%. The left ventricle has normal function. There is no left ventricular  hypertrophy.   2. The left ventricle has no regional wall motion abnormalities.   3. Global right ventricle has normal systolic function.The right  ventricular size is normal. No increase in right ventricular wall  thickness.   4. Left atrial size was normal.   5. Right atrial size was normal.   6. The mitral valve is normal in structure. No evidence of mitral valve  regurgitation. No evidence of mitral stenosis.   7. The tricuspid valve is normal in structure.   8. The tricuspid valve is normal in structure. Tricuspid valve  regurgitation is not demonstrated.   9. The aortic valve is tricuspid. Aortic valve regurgitation is not  visualized. No evidence of aortic valve sclerosis or stenosis.  10. The pulmonic valve was not well visualized. Pulmonic valve  regurgitation is not visualized.  11. The inferior vena cava is normal in size with greater than 50%  respiratory variability, suggesting right atrial pressure of 3 mmHg.     Feb 2021 cath Dist RCA lesion is 95% stenosed. Prox RCA to Mid RCA lesion is 70% stenosed. 1st Mrg lesion is 30% stenosed. Ost LAD to Mid LAD lesion is 35% stenosed. 3rd Diag lesion is 70% stenosed. Mid LAD lesion is 70% stenosed. Post intervention, there is a 0% residual stenosis. Post intervention, there is a 0% residual stenosis. Ost RCA to Prox RCA lesion is 20% stenosed. A stent was successfully placed. A stent was successfully placed.   Multi-vessel CAD with diffuse 30 to 35% stenoses in the proximal LAD prior to the first diagonal vessel with 70% focal stenosis immediately after the first diagonal vessel and diffuse 70% ostial proximal  stenosis in the second diagonal vessel; 30% circumflex marginal-1 stenosis; dominant RCA with mild ostial tapering with 70% diffuse mid stenosis and 95% focal stenosis just proximal to the PDA takeoff distally.   LVEDP 7 mmHg.   Successful PCI to the RCA with PTCA and ultimate insertion of a 2.0 x 12 mm Resolute Onyx DES stent postdilated 2.25 mm in the distal RCA immediately proximal to the PDA takeoff and PCI/DES stenting of the mid RCA with insertion of a 2.5 x 30 mm Resolute Onyx DES stent postdilated to 2.75 mm meters with the stenosis being reduced to 0%.   Post cath RECOMMENDATION: DAPT for minimum of 1 year.  Aggressive lipid-lowering therapy target LDL 60 or below.  Initial medical therapy for concomitant CAD involving the LAD and circumflex marginal vessel.  Smoking cessation is imperative.   05/2020 carotid US IMPRESSION: Right:   Heterogeneous and partially calcified plaque at the right carotid bifurcation contributes to 50%-69% stenosis by  established duplex criteria.   Left:   Color duplex indicates moderate heterogeneous and calcified plaque, with no hemodynamically significant stenosis by duplex criteria in the extracranial cerebrovascular circulation.   Developing right subclavian artery stenosis which contributes to abnormal right vertebral artery waveform.   Assessment and Plan  1. CAD - off beta blocker due to bradycardia - no symptoms, over 1 year since stents placed, will d/c plavix   2. Hyperliidemia - continue atorvastatin, f/u up comgin pcp labs   3. Carotid stenosis - continue medical therapy, repeat carotid US after next visit  4. Sinus bradycardia - EKG shows sinus brady at 51, chrnoic for her and asymptomatic - continue to monitor    Arnoldo Lenis, M.D.

## 2021-03-01 NOTE — Patient Instructions (Signed)
Medication Instructions:  Your physician has recommended you make the following change in your medication:  STOP Plavix 75 mg tablets  *If you need a refill on your cardiac medications before your next appointment, please call your pharmacy*   Lab Work: None If you have labs (blood work) drawn today and your tests are completely normal, you will receive your results only by: MyChart Message (if you have MyChart) OR A paper copy in the mail If you have any lab test that is abnormal or we need to change your treatment, we will call you to review the results.   Testing/Procedures: None   Follow-Up: At [facility], you and your health needs are our priority.  As part of our continuing mission to provide you with exceptional heart care, we have created designated Provider Care Teams.  These Care Teams include your primary Cardiologist (physician) and Advanced Practice Providers (APPs -  Physician Assistants and Nurse Practitioners) who all work together to provide you with the care you need, when you need it.  We recommend signing up for the patient portal called "MyChart".  Sign up information is provided on this After Visit Summary.  MyChart is used to connect with patients for Virtual Visits (Telemedicine).  Patients are able to view lab/test results, encounter notes, upcoming appointments, etc.  Non-urgent messages can be sent to your provider as well.   To learn more about what you can do with MyChart, go to [email or web address].    Your next appointment:   6 month(s)  The format for your next appointment:   In Person  Provider:   Dina Rich, MD   Other Instructions

## 2021-04-15 IMAGING — MG MM BREAST LOCALIZATION CLIP
4 series · 4 of 12 positions shown · non-contrast
Comparison: Previous exam(s).

CLINICAL DATA: 64-year-old female status post ultrasound-guided
biopsy of the left breast.

EXAM:
DIAGNOSTIC LEFT MAMMOGRAM POST ULTRASOUND BIOPSY

[L ML synth-2D]
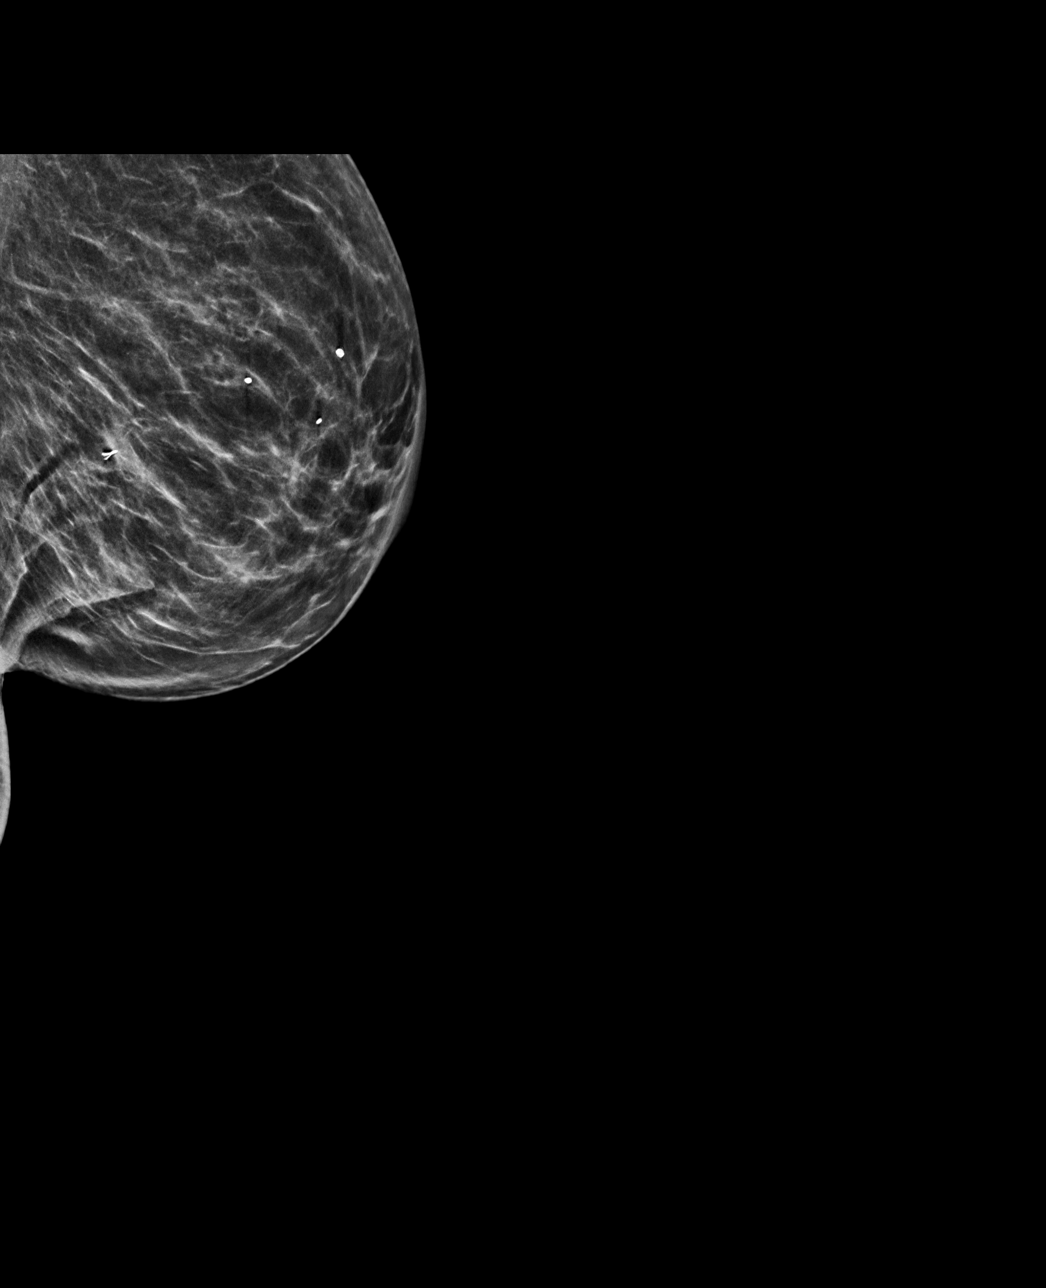

[L CC synth-2D]
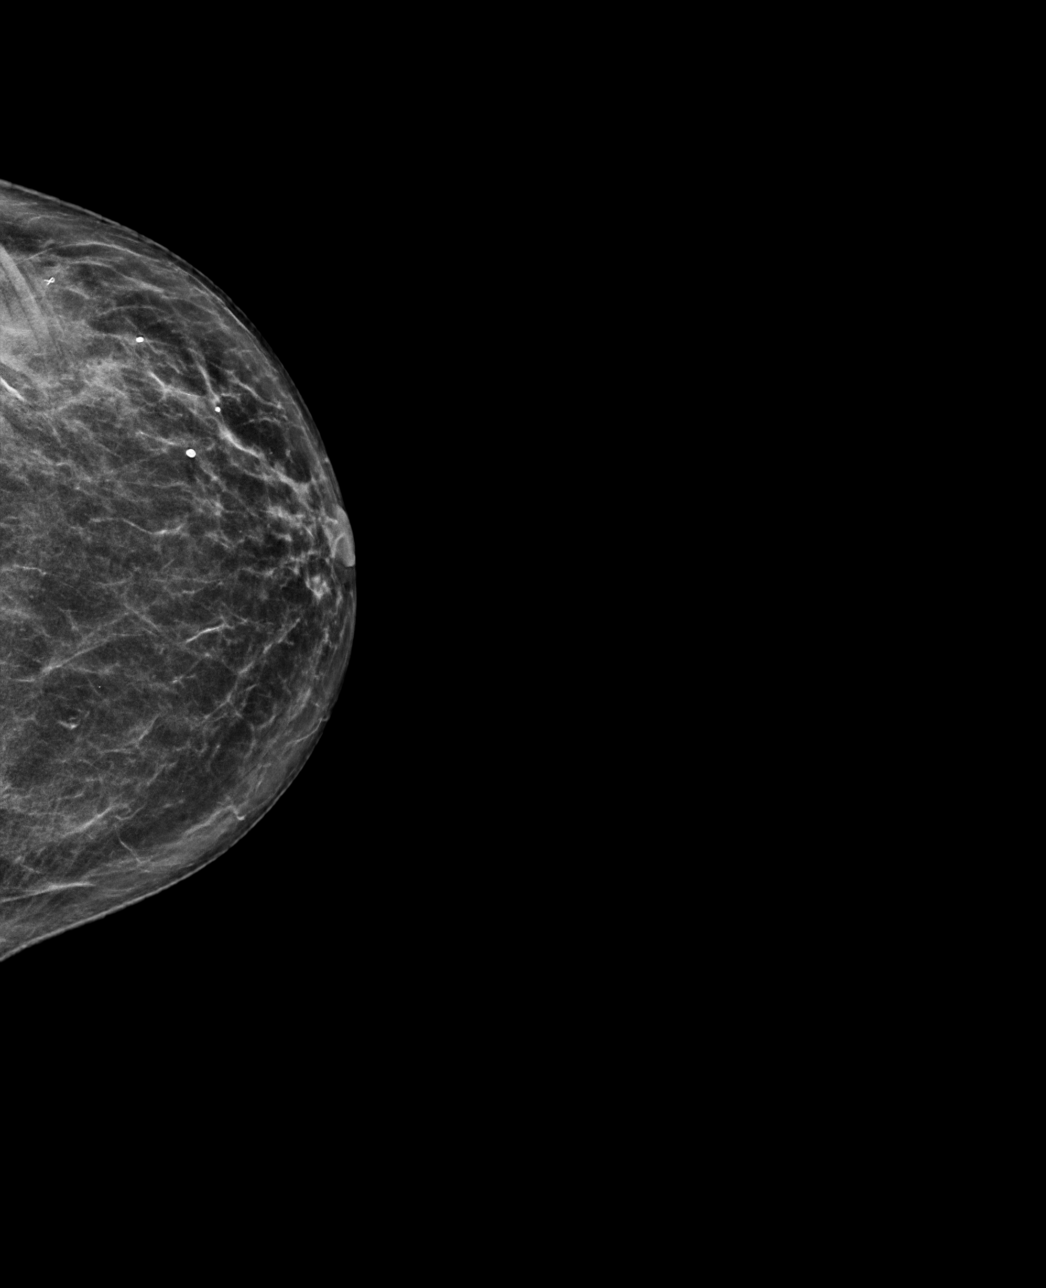

[L CC tomo · tomo slice 33/64.0]
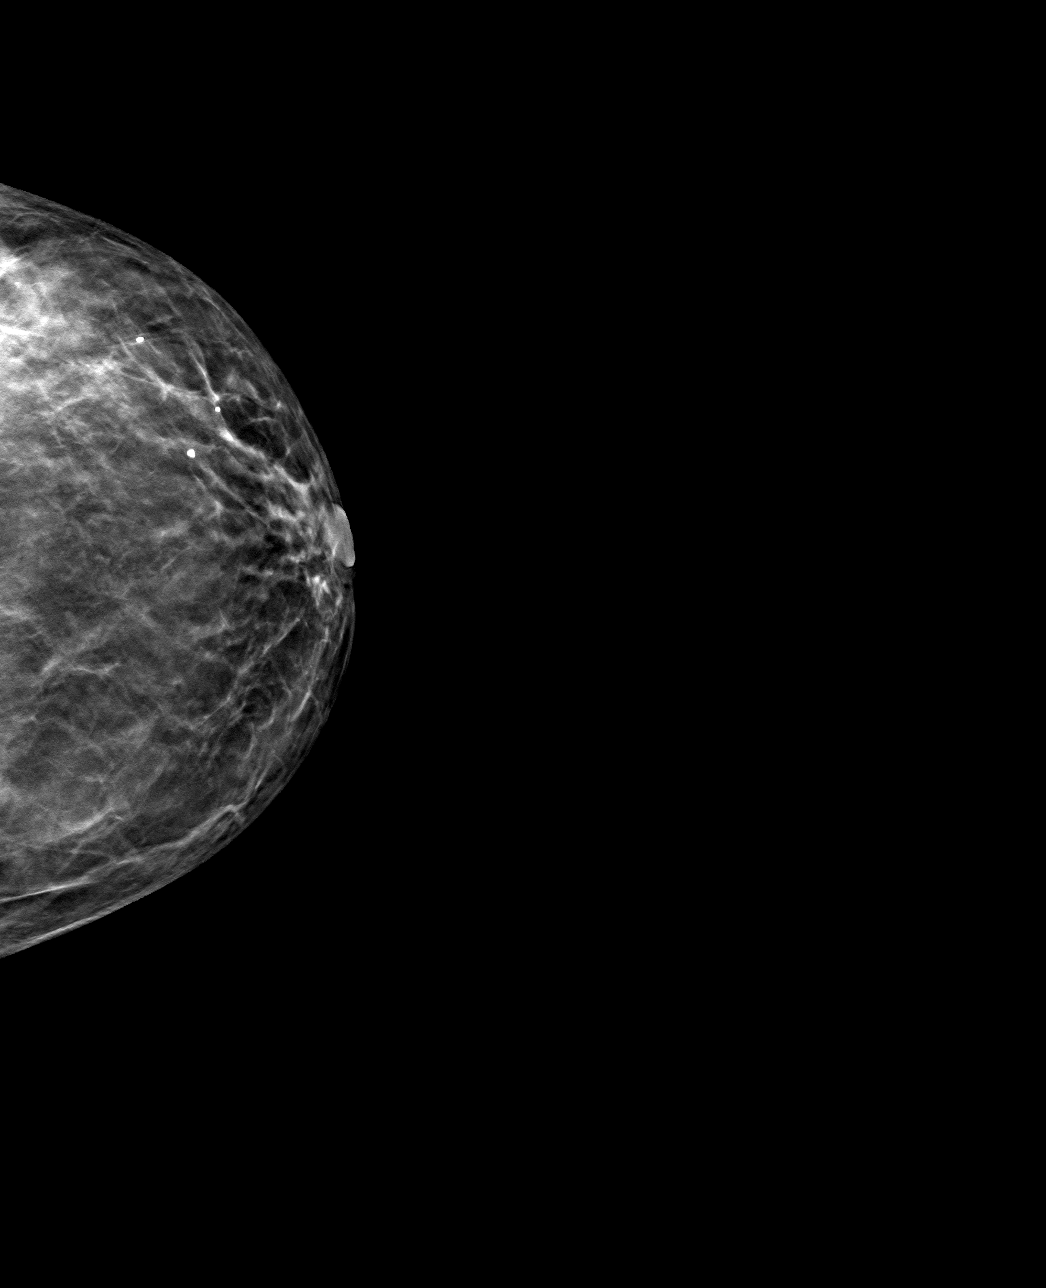

[L ML tomo · tomo slice 34/67.0]
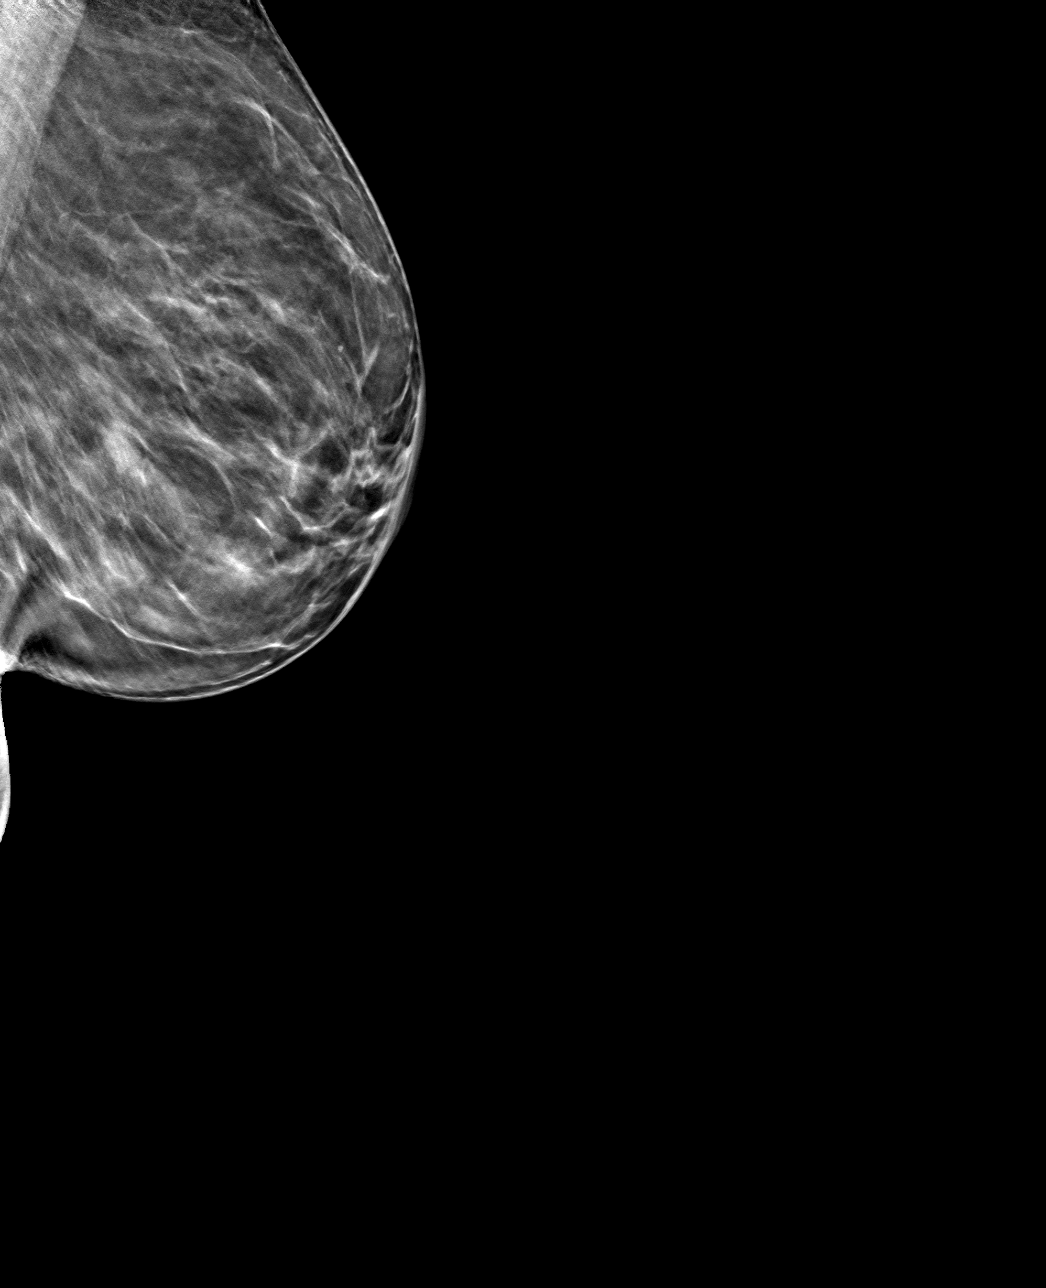

[4 of 12 positions shown; findings below may reference images not displayed]

FINDINGS: Mammographic images were obtained following ultrasound guided biopsy
of the left breast. The biopsy marking clip is in expected position
at the site of biopsy.
IMPRESSION: Appropriate positioning of the ribbon shaped biopsy marking clip at
the site of biopsy in the lower outer left breast.

Final Assessment: Post Procedure Mammograms for Marker Placement

## 2021-04-15 IMAGING — US US BREAST BX W LOC DEV 1ST LESION IMG BX SPEC US GUIDE*L*
1 series · 7 of 7 positions shown · non-contrast
Comparison: Previous exam(s).
COMPARISON: Previous exam(s).

Addendum:
CLINICAL DATA: 64-year-old female with an indeterminate left breast
mass.

EXAM:
ULTRASOUND GUIDED LEFT BREAST CORE NEEDLE BIOPSY

[Series 1: us breast bx w loc dev 1st lesion img bx spec us g · 0.05mm/px · 7 of 7 slices shown]
[im 1/7]
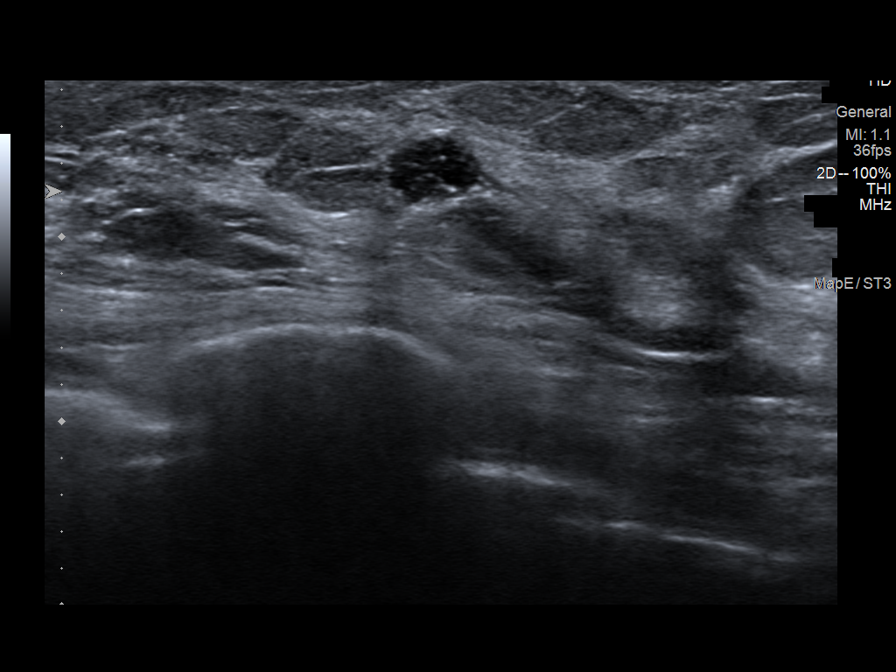
[im 2/7]
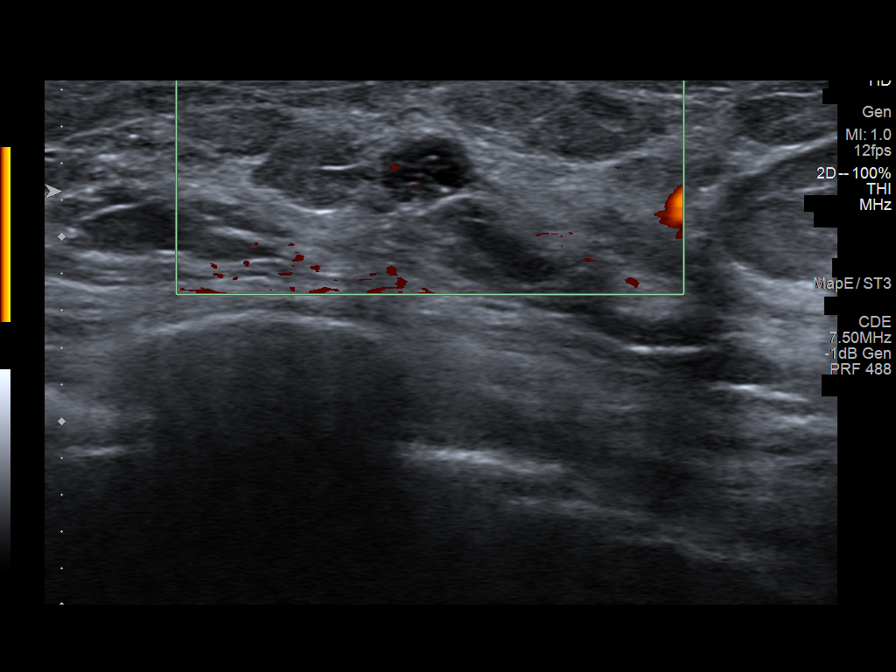
[im 3/7]
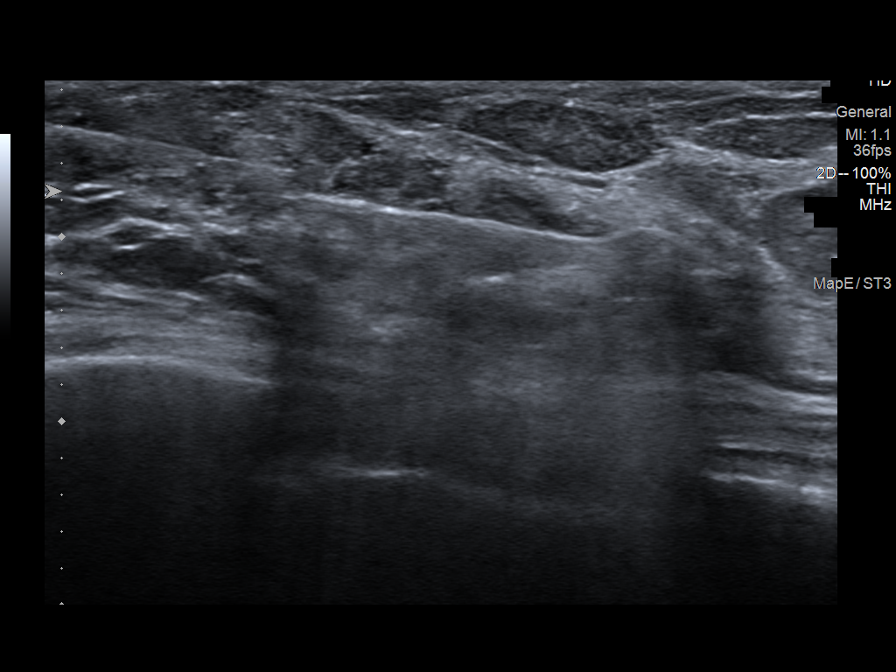
[im 4/7]
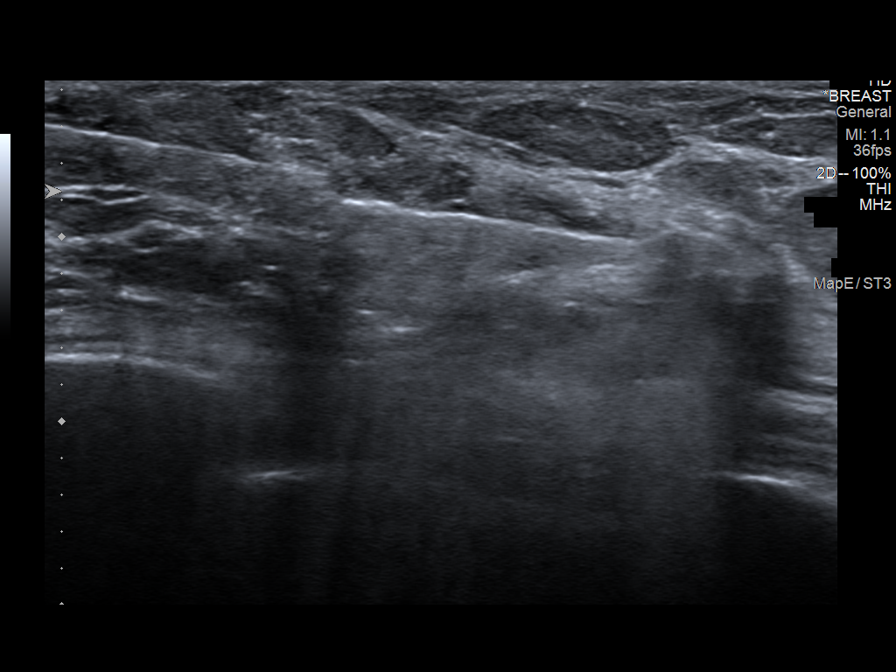
[im 5/7]
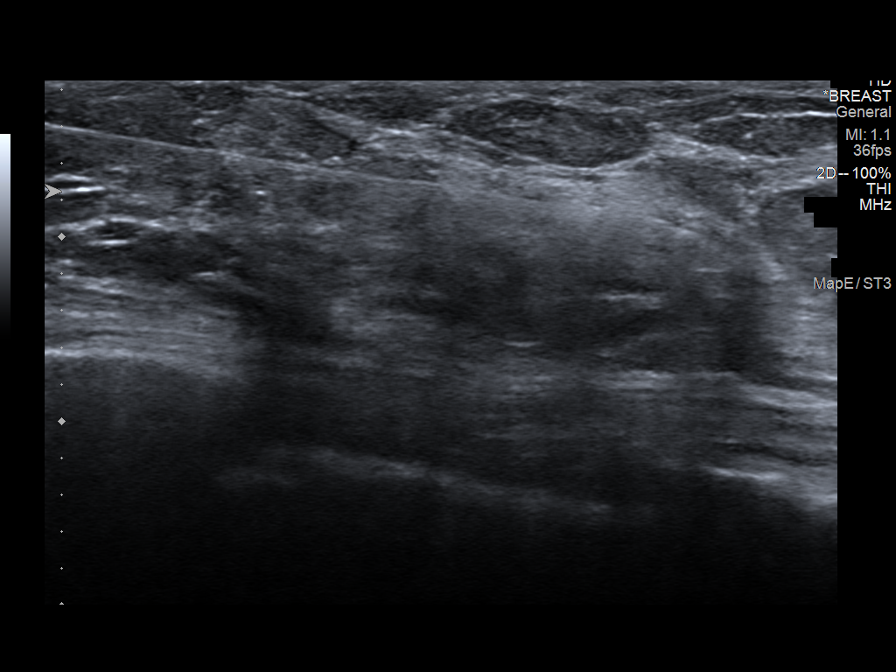
[im 6/7]
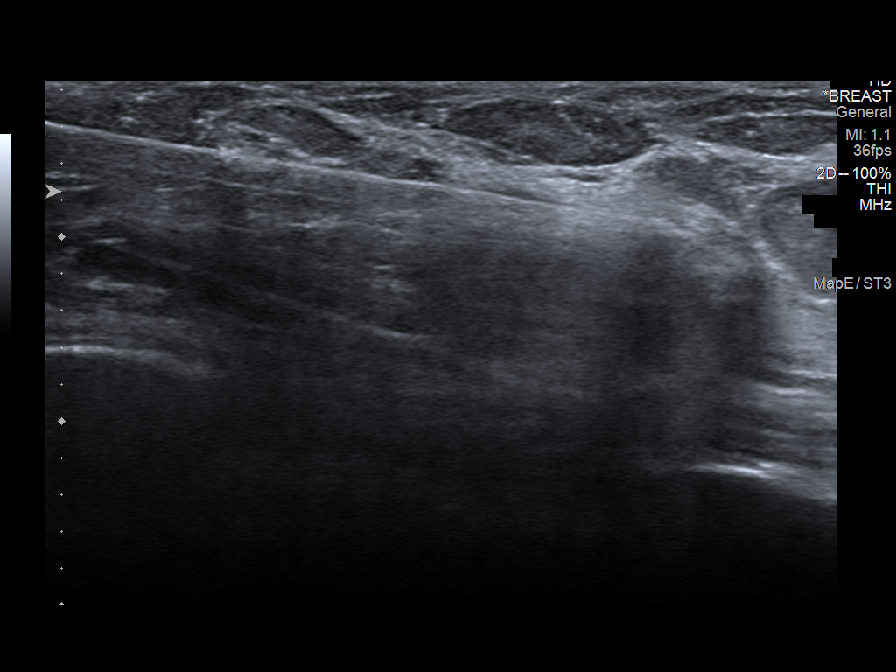
[im 7/7]
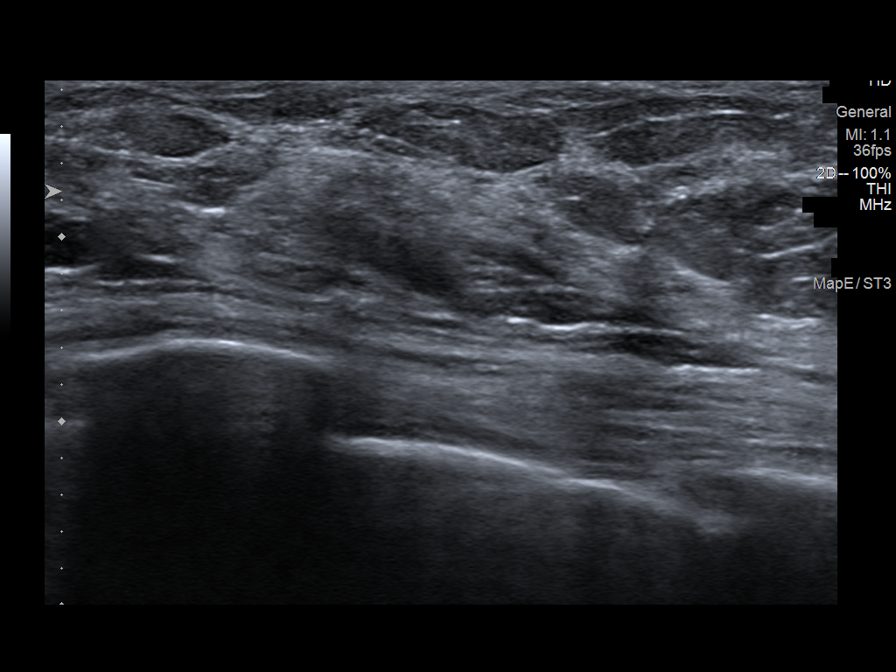

[7 of 7 positions shown; findings below may reference images not displayed]



Lesion quadrant: Lower outer quadrant

Using sterile technique and 1% Lidocaine as local anesthetic, under
direct ultrasound visualization, a 14 gauge Few device was
used to perform biopsy of a hypoechoic mass at the 4 o'clock
position 6 cm from the nipple on the left using a inferior approach.
At the conclusion of the procedure ribbon shaped tissue marker clip
was deployed into the biopsy cavity. Of note, the mass collapsed to
completion after single biopsy pass was taken. Follow up 2 view
mammogram was performed and dictated separately.
IMPRESSION: Ultrasound guided biopsy of the left breast. No apparent
complications.

ADDENDUM:
Pathology revealed FOCAL, DENSE FIBROCYSTIC CHANGES WITH APOCRINE
METAPLASIA of the Left breast, 4:00 o'clock, 9cmfn. This was found
to be concordant by Dr. Lewis Andersen.

Pathology results were discussed with the patient by telephone. The
patient reported doing well after the biopsy with tenderness and
bruising at the site. Post biopsy instructions and care were
reviewed and questions were answered. The patient was encouraged to
call The [REDACTED] for any additional
concerns.

The patient is scheduled for a Left breast stereotatic guided biopsy
at The [REDACTED] on July 01, 2020. Further recommendations
will be guided by the results of this biopsy.

Pathology results reported by Kunstdepot Tudorita, RN on 06/27/2020.



Lesion quadrant: Lower outer quadrant

Using sterile technique and 1% Lidocaine as local anesthetic, under
direct ultrasound visualization, a 14 gauge Few device was
used to perform biopsy of a hypoechoic mass at the 4 o'clock
position 6 cm from the nipple on the left using a inferior approach.
At the conclusion of the procedure ribbon shaped tissue marker clip
was deployed into the biopsy cavity. Of note, the mass collapsed to
completion after single biopsy pass was taken. Follow up 2 view
mammogram was performed and dictated separately.
IMPRESSION: Ultrasound guided biopsy of the left breast. No apparent
complications.

## 2021-04-22 IMAGING — MG MM BREAST LOCALIZATION CLIP
4 series · 4 of 12 positions shown · non-contrast
Comparison: Previous exams.

CLINICAL DATA: Post stereotactic guided biopsy of calcifications in
the upper-outer left breast.

EXAM:
DIAGNOSTIC LEFT MAMMOGRAM POST STEREOTACTIC BIOPSY

[L CC synth-2D]
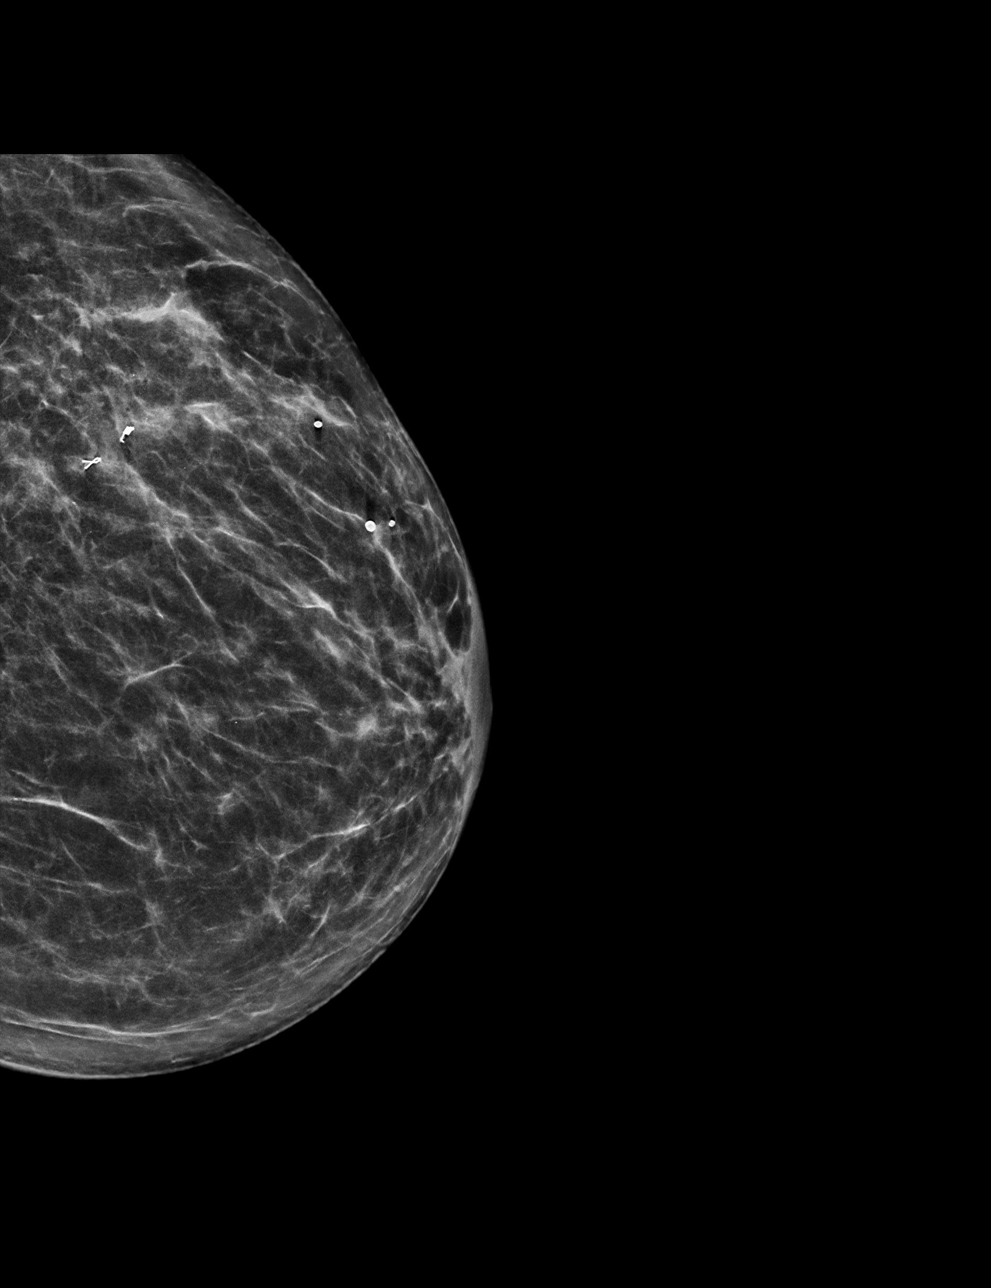

[L LM synth-2D]
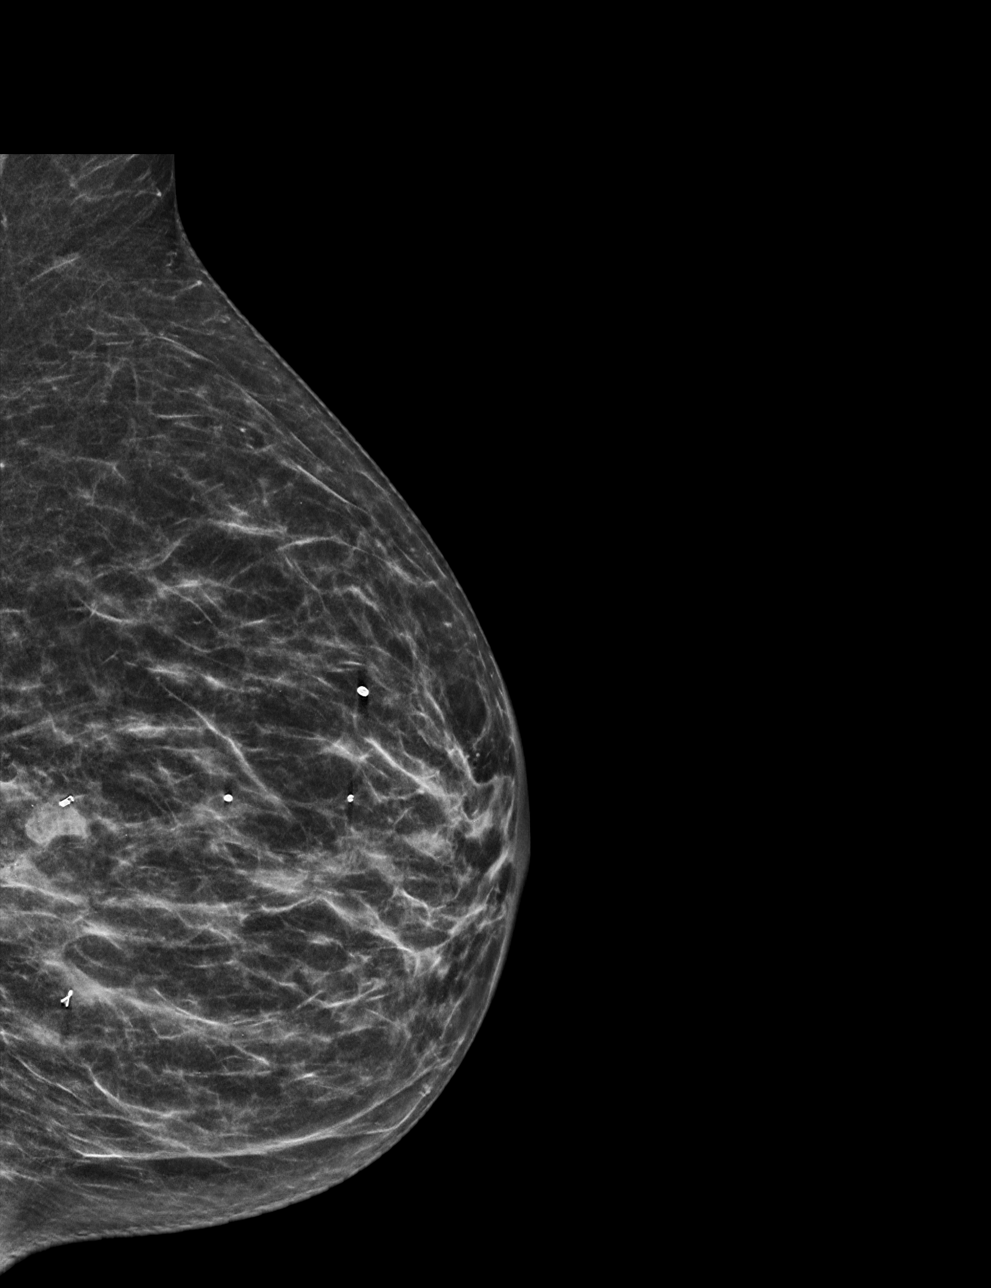

[L CC tomo · tomo slice 29/57.0]
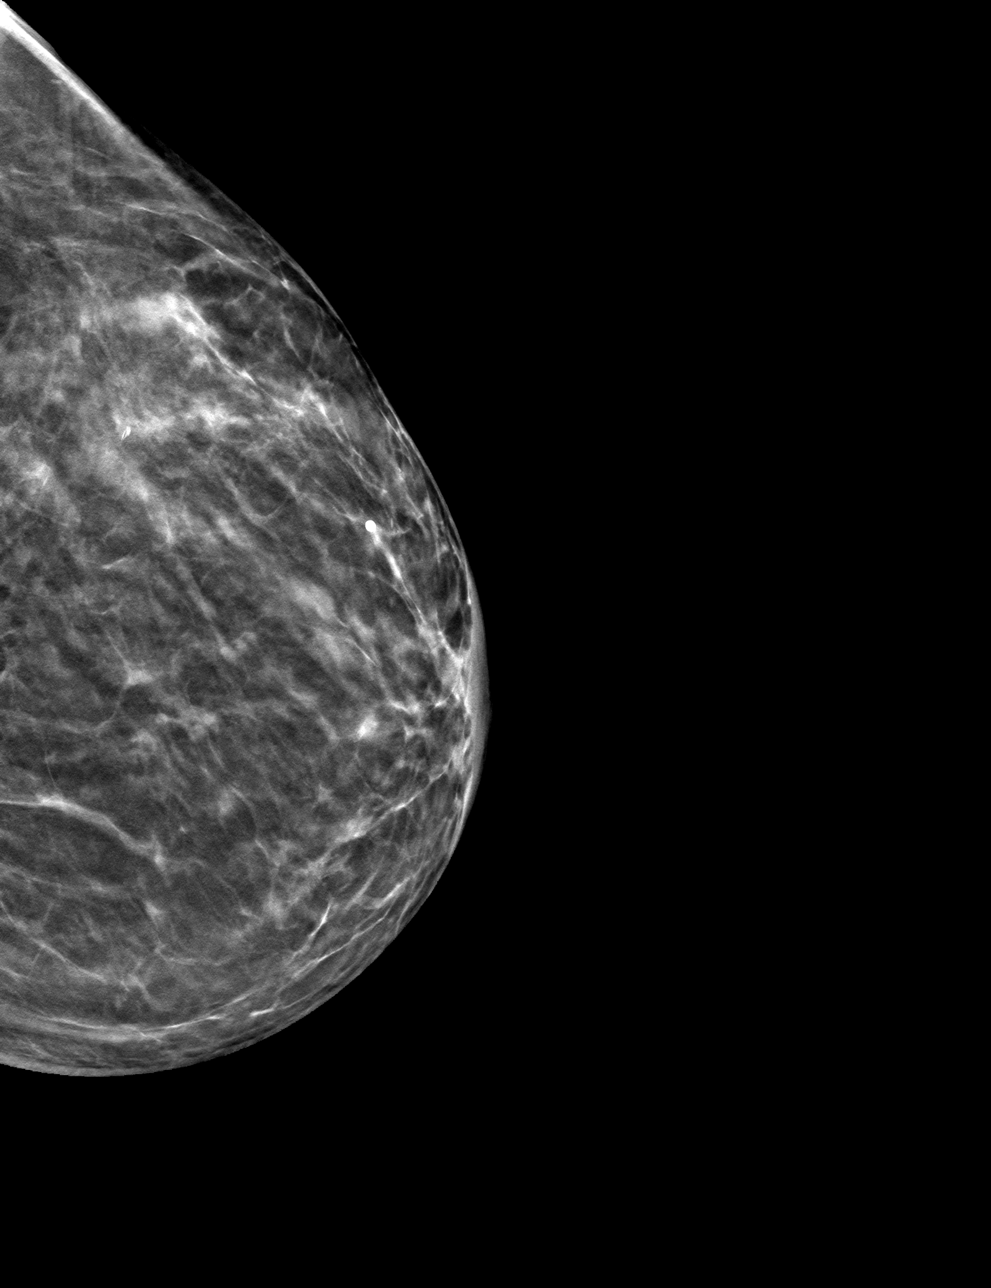

[L LM tomo · tomo slice 27/53.0]
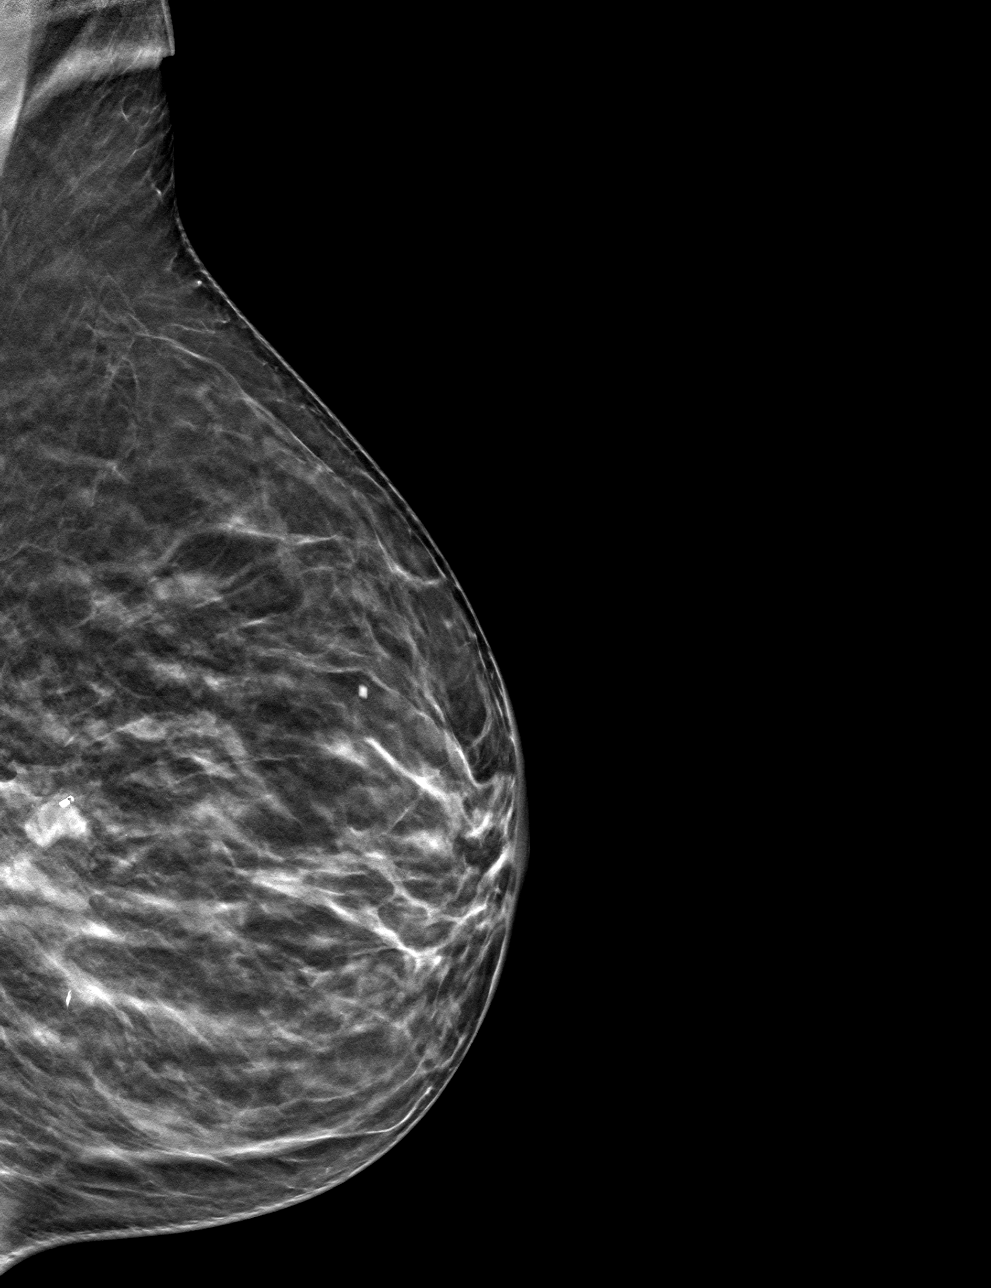

[4 of 12 positions shown; findings below may reference images not displayed]

FINDINGS: Mammographic images were obtained following stereotactic guided
biopsy of calcifications in the upper-outer posterior left breast. A
coil shaped biopsy marking clip is present at the site of the
biopsied calcifications in the left breast.
IMPRESSION: Coil shaped biopsy marking clip at site of biopsied calcifications
in upper-outer posterior left breast.

Final Assessment: Post Procedure Mammograms for Marker Placement

## 2021-05-05 ENCOUNTER — Other Ambulatory Visit (HOSPITAL_COMMUNITY): Payer: Self-pay | Admitting: Internal Medicine

## 2021-05-05 DIAGNOSIS — Z1231 Encounter for screening mammogram for malignant neoplasm of breast: Secondary | ICD-10-CM

## 2021-05-18 ENCOUNTER — Ambulatory Visit (HOSPITAL_COMMUNITY): Payer: Medicare Other

## 2021-05-31 ENCOUNTER — Ambulatory Visit (HOSPITAL_COMMUNITY): Payer: Medicare Other

## 2021-06-14 ENCOUNTER — Other Ambulatory Visit: Payer: Self-pay

## 2021-06-14 ENCOUNTER — Ambulatory Visit (HOSPITAL_COMMUNITY)
Admission: RE | Admit: 2021-06-14 | Discharge: 2021-06-14 | Disposition: A | Discharge status: Home / Self Care | Payer: Medicare Other | Source: Ambulatory Visit | Attending: Internal Medicine | Admitting: Internal Medicine

## 2021-06-14 DIAGNOSIS — Z1231 Encounter for screening mammogram for malignant neoplasm of breast: Secondary | ICD-10-CM | POA: Insufficient documentation

## 2021-06-18 ENCOUNTER — Other Ambulatory Visit: Payer: Self-pay | Admitting: Cardiology

## 2021-09-06 ENCOUNTER — Ambulatory Visit: Payer: Medicare Other | Admitting: Cardiology

## 2021-10-19 ENCOUNTER — Ambulatory Visit (INDEPENDENT_AMBULATORY_CARE_PROVIDER_SITE_OTHER): Payer: Medicare Other | Admitting: Student

## 2021-10-19 ENCOUNTER — Encounter: Payer: Self-pay | Admitting: Student

## 2021-10-19 VITALS — BP 106/54 | HR 62 | Ht 67.0 in | Wt 119.0 lb

## 2021-10-19 DIAGNOSIS — I10 Essential (primary) hypertension: Secondary | ICD-10-CM

## 2021-10-19 DIAGNOSIS — E785 Hyperlipidemia, unspecified: Secondary | ICD-10-CM

## 2021-10-19 DIAGNOSIS — I6523 Occlusion and stenosis of bilateral carotid arteries: Secondary | ICD-10-CM | POA: Diagnosis not present

## 2021-10-19 DIAGNOSIS — I2510 Atherosclerotic heart disease of native coronary artery without angina pectoris: Secondary | ICD-10-CM

## 2021-10-19 MED ORDER — ATORVASTATIN CALCIUM 40 MG PO TABS: ORAL_TABLET | ORAL | 3 refills | Status: DC

## 2021-10-19 NOTE — Progress Notes (Signed)
? ?Cardiology Office Note   ? ?Date:  10/19/2021  ? ?ID:  Jill Perry, DOB 13-Feb-1956, MRN [id number] ? ?PCP:  Benita Stabile, MD  ?Cardiologist: Dina Rich, MD   ? ?Chief Complaint  ?Patient presents with  ? Follow-up  ?  6 month visit  ? ? ?History of Present Illness:   ? ?Jill Perry is a 66 y.o. female with past medical history of CAD (s/p DES to mid-RCA and DES to distal-RCA in 07/2019), HTN, HLD, carotid artery stenosis and tobacco use who presents to the office today for 71-month follow-up. ? ?She was last examined by Dr. Wyline Mood in 02/2021 and denied any recent chest pain or dyspnea on exertion at that time. She previously had elevation of LFT's with higher doses of Atorvastatin, therefore she was continued on Atorvastatin 40 mg daily along with ASA and Lisinopril. Plavix was discontinued given that she was more than a year out from stent placement. ? ?In talking with the patient today, she reports overall doing well from a cardiac perspective since her last office visit. She denies any recent chest pain or dyspnea on exertion. No recent orthopnea, PND or pitting edema. She has been under increased stress as her mother-in-law and father-in-law passed away within the past few years and they are currently cleaning the house and processing the estate. She also reports her mother was recently diagnosed with worsening dementia. ? ? ?Past Medical History:  ?Diagnosis Date  ? CAD (coronary artery disease)   ? a. s/p NSTEMI in 07/2019 with DES to mid-RCA and DES to distal-RCA.   ? Hyperlipidemia LDL goal <70   ? Tobacco use   ? ? ?Past Surgical History:  ?Procedure Laterality Date  ? CORONARY STENT INTERVENTION N/A 08/24/2019  ? Procedure: CORONARY STENT INTERVENTION;  Surgeon: Lennette Bihari, MD;  Location: [facility] INVASIVE CV LAB;  Service: Cardiovascular;  Laterality: N/A;  ? LEFT HEART CATH AND CORONARY ANGIOGRAPHY N/A 08/24/2019  ? Procedure: LEFT HEART CATH AND CORONARY ANGIOGRAPHY;  Surgeon: Lennette Bihari, MD;  Location: [facility] INVASIVE CV LAB;  Service: Cardiovascular;  Laterality: N/A;  ? ? ?Current Medications: ?Outpatient Medications Prior to Visit  ?Medication Sig Dispense Refill  ? aspirin EC 81 MG EC tablet Take 1 tablet (81 mg total) by mouth daily. 30 tablet 11  ? lisinopril (ZESTRIL) 2.5 MG tablet TAKE 1 TABLET(2.5 MG) BY MOUTH DAILY 90 tablet 3  ? atorvastatin (LIPITOR) 40 MG tablet TAKE 1 TABLET(40 MG) BY MOUTH DAILY 90 tablet 3  ? atorvastatin (LIPITOR) 40 MG tablet Take 1 tablet (40 mg total) by mouth daily. 90 tablet 3  ? ?No facility-administered medications prior to visit.  ?  ? ?Allergies:   Patient has no known allergies.  ? ?Social History  ? ?Socioeconomic History  ? Marital status: Married  ?  Spouse name: Not on file  ? Number of children: Not on file  ? Years of education: Not on file  ? Highest education level: Not on file  ?Occupational History  ? Not on file  ?Tobacco Use  ? Smoking status: Every Day  ? Smokeless tobacco: Never  ?Vaping Use  ? Vaping Use: Never used  ?Substance and Sexual Activity  ? Alcohol use: Never  ? Drug use: Never  ? Sexual activity: Not Currently  ?Other Topics Concern  ? Not on file  ?Social History Narrative  ? Not on file  ? ?Social Determinants of Health  ? ?Financial Resource Strain: Not  on file  ?Food Insecurity: Not on file  ?Transportation Needs: Not on file  ?Physical Activity: Not on file  ?Stress: Not on file  ?Social Connections: Not on file  ?  ? ?Family History:  The patient's family history includes Diabetes in her mother; Hypertension in her mother.  ? ?Review of Systems:   ? ?Please see the history of present illness.    ? ?All other systems reviewed and are otherwise negative except as noted above. ? ? ?Physical Exam:   ? ?VS:  BP (!) 106/54   Pulse 62   Ht 5\' 7"  (1.702 m)   Wt 119 lb (54 kg)   SpO2 99%   BMI 18.64 kg/m?    ?General: Well developed, well nourished,female appearing in no acute distress. ?Head: Normocephalic,  atraumatic. ?Neck: No carotid bruits. JVD not elevated.  ?Lungs: Respirations regular and unlabored, without wheezes or rales.  ?Heart: Regular rate and rhythm. No S3 or S4.  No murmur, no rubs, or gallops appreciated. ?Abdomen: Appears non-distended. No obvious abdominal masses. ?Msk:  Strength and tone appear normal for age. No obvious joint deformities or effusions. ?Extremities: No clubbing or cyanosis. No pitting edema.  Distal pedal pulses are 2+ bilaterally. ?Neuro: Alert and oriented X 3. Moves all extremities spontaneously. No focal deficits noted. ?Psych:  Responds to questions appropriately with a normal affect. ?Skin: No rashes or lesions noted ? ?Wt Readings from Last 3 Encounters:  ?10/19/21 119 lb (54 kg)  ?03/01/21 120 lb (54.4 kg)  ?08/30/20 123 lb 6.4 oz (56 kg)  ?  ? ?Studies/Labs Reviewed:  ? ?EKG:  EKG is not ordered today.   ? ?Recent Labs: ?No results found for requested labs within last 8760 hours.  ? ?Lipid Panel ?   ?Component Value Date/Time  ? CHOL 141 12/03/2019 0803  ? TRIG 104 12/03/2019 0803  ? HDL 49 (L) 12/03/2019 0803  ? CHOLHDL 2.9 12/03/2019 0803  ? VLDL 18 08/24/2019 0719  ? LDLCALC 73 12/03/2019 0803  ? ? ?Additional studies/ records that were reviewed today include:  ? ?Echocardiogram: 07/2019 ?IMPRESSIONS  ? ? ? 1. Left ventricular ejection fraction, by visual estimation, is 60 to  ?65%. The left ventricle has normal function. There is no left ventricular  ?hypertrophy.  ? 2. The left ventricle has no regional wall motion abnormalities.  ? 3. Global right ventricle has normal systolic function.The right  ?ventricular size is normal. No increase in right ventricular wall  ?thickness.  ? 4. Left atrial size was normal.  ? 5. Right atrial size was normal.  ? 6. The mitral valve is normal in structure. No evidence of mitral valve  ?regurgitation. No evidence of mitral stenosis.  ? 7. The tricuspid valve is normal in structure.  ? 8. The tricuspid valve is normal in structure.  Tricuspid valve  ?regurgitation is not demonstrated.  ? 9. The aortic valve is tricuspid. Aortic valve regurgitation is not  ?visualized. No evidence of aortic valve sclerosis or stenosis.  ?10. The pulmonic valve was not well visualized. Pulmonic valve  ?regurgitation is not visualized.  ?11. The inferior vena cava is normal in size with greater than 50%  ?respiratory variability, suggesting right atrial pressure of 3 mmHg. ? ?LHC: 08/2019 ?Dist RCA lesion is 95% stenosed. ?Prox RCA to Mid RCA lesion is 70% stenosed. ?1st Mrg lesion is 30% stenosed. ?Ost LAD to Mid LAD lesion is 35% stenosed. ?3rd Diag lesion is 70% stenosed. ?Mid LAD lesion is 70% stenosed. ?  Post intervention, there is a 0% residual stenosis. ?Post intervention, there is a 0% residual stenosis. ?Ost RCA to Prox RCA lesion is 20% stenosed. ?A stent was successfully placed. ?A stent was successfully placed. ?  ?Multi-vessel CAD with diffuse 30 to 35% stenoses in the proximal LAD prior to the first diagonal vessel with 70% focal stenosis immediately after the first diagonal vessel and diffuse 70% ostial proximal stenosis in the second diagonal vessel; 30% circumflex marginal-1 stenosis; dominant RCA with mild ostial tapering with 70% diffuse mid stenosis and 95% focal stenosis just proximal to the PDA takeoff distally. ?  ?LVEDP 7 mmHg. ?  ?Successful PCI to the RCA with PTCA and ultimate insertion of a 2.0 x 12 mm Resolute Onyx DES stent postdilated 2.25 mm in the distal RCA immediately proximal to the PDA takeoff and PCI/DES stenting of the mid RCA with insertion of a 2.5 x 30 mm Resolute Onyx DES stent postdilated to 2.75 mm meters with the stenosis being reduced to 0%. ?  ?RECOMMENDATION: ?DAPT for minimum of 1 year.  Aggressive lipid-lowering therapy target LDL 60 or below.  Initial medical therapy for concomitant CAD involving the LAD and circumflex marginal vessel.  Smoking cessation is imperative. ?  ? ?Carotid Dopplers:  05/2020 ?IMPRESSION: ?Right: ?  ?Heterogeneous and partially calcified plaque at the right carotid ?bifurcation contributes to 50%-69% stenosis by established duplex ?criteria. ?  ?Left: ?  ?Color duplex indicates moderate heterogeneous and calci

## 2021-10-19 NOTE — Patient Instructions (Signed)
Medication Instructions:  Your physician recommends that you continue on your current medications as directed. Please refer to the Current Medication list given to you today.  *If you need a refill on your cardiac medications before your next appointment, please call your pharmacy*   Lab Work: NONE  If you have labs (blood work) drawn today and your tests are completely normal, you will receive your results only by: . MyChart Message (if you have MyChart) OR . A paper copy in the mail If you have any lab test that is abnormal or we need to change your treatment, we will call you to review the results.   Testing/Procedures: Your physician has requested that you have a carotid duplex. This test is an ultrasound of the carotid arteries in your neck. It looks at blood flow through these arteries that supply the brain with blood. Allow one hour for this exam. There are no restrictions or special instructions.    Follow-Up: At [facility], you and your health needs are our priority.  As part of our continuing mission to provide you with exceptional heart care, we have created designated Provider Care Teams.  These Care Teams include your primary Cardiologist (physician) and Advanced Practice Providers (APPs -  Physician Assistants and Nurse Practitioners) who all work together to provide you with the care you need, when you need it.  We recommend signing up for the patient portal called "MyChart".  Sign up information is provided on this After Visit Summary.  MyChart is used to connect with patients for Virtual Visits (Telemedicine).  Patients are able to view lab/test results, encounter notes, upcoming appointments, etc.  Non-urgent messages can be sent to your provider as well.   To learn more about what you can do with MyChart, go to [email or web address].    Your next appointment:   6 month(s)  The format for your next appointment:   In Person  Provider:   Jonathan Branch,  MD   Other Instructions Thank you for choosing [facility]!    

## 2021-10-22 ENCOUNTER — Other Ambulatory Visit: Payer: Self-pay | Admitting: Cardiology

## 2021-11-07 ENCOUNTER — Other Ambulatory Visit: Payer: Self-pay | Admitting: Family Medicine

## 2021-11-07 ENCOUNTER — Other Ambulatory Visit (HOSPITAL_COMMUNITY): Payer: Self-pay | Admitting: Family Medicine

## 2021-11-09 ENCOUNTER — Ambulatory Visit (HOSPITAL_COMMUNITY): Payer: Medicare Other

## 2021-11-09 ENCOUNTER — Encounter (HOSPITAL_COMMUNITY): Payer: Self-pay

## 2021-11-13 ENCOUNTER — Ambulatory Visit (HOSPITAL_COMMUNITY): Payer: Medicare Other

## 2021-11-15 ENCOUNTER — Ambulatory Visit (HOSPITAL_COMMUNITY)
Admission: RE | Admit: 2021-11-15 | Discharge: 2021-11-15 | Disposition: A | Discharge status: Home / Self Care | Payer: Medicare Other | Source: Ambulatory Visit | Attending: Student | Admitting: Student

## 2021-11-15 ENCOUNTER — Other Ambulatory Visit: Payer: Self-pay

## 2021-11-15 DIAGNOSIS — I6523 Occlusion and stenosis of bilateral carotid arteries: Secondary | ICD-10-CM | POA: Diagnosis not present

## 2022-05-07 ENCOUNTER — Other Ambulatory Visit (HOSPITAL_COMMUNITY): Payer: Self-pay | Admitting: Internal Medicine

## 2022-05-07 DIAGNOSIS — Z1231 Encounter for screening mammogram for malignant neoplasm of breast: Secondary | ICD-10-CM

## 2022-05-23 ENCOUNTER — Encounter: Payer: Self-pay | Admitting: Internal Medicine

## 2022-05-23 ENCOUNTER — Other Ambulatory Visit (HOSPITAL_COMMUNITY): Payer: Self-pay | Admitting: Internal Medicine

## 2022-05-23 DIAGNOSIS — M8588 Other specified disorders of bone density and structure, other site: Secondary | ICD-10-CM

## 2022-05-25 NOTE — Progress Notes (Signed)
Cardiology Office Note:    Date:  06/05/2022   ID:  Jill Perry, DOB Dec 24, 1955, MRN [id number]  PCP:  Benita Stabile, MD  [facility] Providers Cardiologist:  Dina Rich, MD     Referring MD: Benita Stabile, MD   Chief Complaint:  Follow-up     History of Present Illness:   Jill Perry is a 66 y.o. female with past medical history of CAD (s/p DES to mid-RCA and DES to distal-RCA in 07/2019), HTN, HLD, carotid artery stenosis and tobacco use.  Patient saw Ms. Strader, PA-C 09/2021 and was doing well. Repeat dopplers RICA 50-69% LICA <50%.  Patient comes in for f/u. She has been doing well. She walks about 1 1/2-2 miles daily. On the move all the time. Smoking 1/2 ppd and trying to quit. Denies chest pain, dyspnea, palpitations, edema.     Past Medical History:  Diagnosis Date   CAD (coronary artery disease)    a. s/p NSTEMI in 07/2019 with DES to mid-RCA and DES to distal-RCA.    Hyperlipidemia LDL goal <70    Tobacco use    Current Medications: Current Meds  Medication Sig   aspirin EC 81 MG EC tablet Take 1 tablet (81 mg total) by mouth daily.   atorvastatin (LIPITOR) 40 MG tablet TAKE 1 TABLET(40 MG) BY MOUTH DAILY   cholecalciferol (VITAMIN D3) 25 MCG (1000 UNIT) tablet Take 1,000 Units by mouth daily.   lisinopril (ZESTRIL) 2.5 MG tablet TAKE 1 TABLET(2.5 MG) BY MOUTH DAILY    Allergies:   Patient has no known allergies.   Social History   Tobacco Use   Smoking status: Every Day   Smokeless tobacco: Never  Vaping Use   Vaping Use: Never used  Substance Use Topics   Alcohol use: Never   Drug use: Never    Family Hx: The patient's family history includes Diabetes in her mother; Hypertension in her mother.  ROS     Physical Exam:    VS:  BP (!) 108/58   Pulse 66   Ht 5\' 7"  (1.702 m)   Wt 120 lb (54.4 kg)   SpO2 99%   BMI 18.79 kg/m     Wt Readings from Last 3 Encounters:  06/05/22 120 lb (54.4 kg)  10/19/21 119 lb (54 kg)   03/01/21 120 lb (54.4 kg)    Physical Exam  GEN: Thin, in no acute distress  Neck: no JVD, carotid bruits, or masses Cardiac:RRR; no murmurs, rubs, or gallops  Respiratory:  clear to auscultation bilaterally, normal work of breathing GI: soft, nontender, nondistended, + BS Ext: without cyanosis, clubbing, or edema, Good distal pulses bilaterally Neuro:  Alert and Oriented x 3,   Psych: euthymic mood, full affect        EKGs/Labs/Other Test Reviewed:    EKG:  EKG is not ordered today.     Recent Labs: No results found for requested labs within last 365 days.   Recent Lipid Panel No results for input(s): "CHOL", "TRIG", "HDL", "VLDL", "LDLCALC", "LDLDIRECT" in the last 8760 hours.   Prior CV Studies:  Carotid dopplers 10/2021 IMPRESSION: Right ICA stenosis remains estimated at 50-69%.   Left ICA narrowing less than 50%.   Similar abnormal right vertebral artery waveform suggestive of central right subclavian stenosis.     Electronically Signed   By: 11/2021.  Shick M.D.   On: 11/15/2021 13:19  Echocardiogram: 07/2019 IMPRESSIONS     1. Left ventricular ejection fraction,  by visual estimation, is 60 to  65%. The left ventricle has normal function. There is no left ventricular  hypertrophy.   2. The left ventricle has no regional wall motion abnormalities.   3. Global right ventricle has normal systolic function.The right  ventricular size is normal. No increase in right ventricular wall  thickness.   4. Left atrial size was normal.   5. Right atrial size was normal.   6. The mitral valve is normal in structure. No evidence of mitral valve  regurgitation. No evidence of mitral stenosis.   7. The tricuspid valve is normal in structure.   8. The tricuspid valve is normal in structure. Tricuspid valve  regurgitation is not demonstrated.   9. The aortic valve is tricuspid. Aortic valve regurgitation is not  visualized. No evidence of aortic valve sclerosis or stenosis.   10. The pulmonic valve was not well visualized. Pulmonic valve  regurgitation is not visualized.  11. The inferior vena cava is normal in size with greater than 50%  respiratory variability, suggesting right atrial pressure of 3 mmHg.   LHC: 08/2019 Dist RCA lesion is 95% stenosed. Prox RCA to Mid RCA lesion is 70% stenosed. 1st Mrg lesion is 30% stenosed. Ost LAD to Mid LAD lesion is 35% stenosed. 3rd Diag lesion is 70% stenosed. Mid LAD lesion is 70% stenosed. Post intervention, there is a 0% residual stenosis. Post intervention, there is a 0% residual stenosis. Ost RCA to Prox RCA lesion is 20% stenosed. A stent was successfully placed. A stent was successfully placed.   Multi-vessel CAD with diffuse 30 to 35% stenoses in the proximal LAD prior to the first diagonal vessel with 70% focal stenosis immediately after the first diagonal vessel and diffuse 70% ostial proximal stenosis in the second diagonal vessel; 30% circumflex marginal-1 stenosis; dominant RCA with mild ostial tapering with 70% diffuse mid stenosis and 95% focal stenosis just proximal to the PDA takeoff distally.   LVEDP 7 mmHg.   Successful PCI to the RCA with PTCA and ultimate insertion of a 2.0 x 12 mm Resolute Onyx DES stent postdilated 2.25 mm in the distal RCA immediately proximal to the PDA takeoff and PCI/DES stenting of the mid RCA with insertion of a 2.5 x 30 mm Resolute Onyx DES stent postdilated to 2.75 mm meters with the stenosis being reduced to 0%.   RECOMMENDATION: DAPT for minimum of 1 year.  Aggressive lipid-lowering therapy target LDL 60 or below.  Initial medical therapy for concomitant CAD involving the LAD and circumflex marginal vessel.  Smoking cessation is imperative.     Carotid Dopplers: 05/2020 IMPRESSION: Right:   Heterogeneous and partially calcified plaque at the right carotid bifurcation contributes to 50%-69% stenosis by established duplex criteria.   Left:   Color duplex  indicates moderate heterogeneous and calcified plaque, with no hemodynamically significant stenosis by duplex criteria in the extracranial cerebrovascular circulation.   Developing right subclavian artery stenosis which contributes to abnormal right vertebral artery waveform.          Risk Assessment/Calculations/Metrics:              ASSESSMENT & PLAN:   No problem-specific Assessment & Plan notes found for this encounter.   1. CAD - She is s/p DES to mid-RCA and DES to distal-RCA in 07/2019. She remains active and denies any recent anginal symptoms. Continue current medication regimen with ASA 81 mg daily, Atorvastatin 40 mg daily and Lisinopril 2.5 mg daily. She has not been  on beta-blocker therapy given baseline bradycardia.   2. HTN - Her BP is at 108/58  -Continue Lisinopril 2.5 mg daily.   3. HLD - FLP in 04/2022  LDL 71. She remains on Atorvastatin 40 mg daily as she previously had elevated LFT's with higher dosing.   4. Carotid Artery Stenosis  - Prior dopplers in 10/2021 showed 50 to 69% RICA stenosis and no significant stenosis along the LICA.  repeat carotid dopplers scheduled 10/2022 . Continue ASA and statin therapy.   5. Tobacco abuse. Continues to smoke 1/2 ppd and trying to quit on her own. Long discussion on importance of smoking cessation.            Dispo:  No follow-ups on file.   Medication Adjustments/Labs and Tests Ordered: Current medicines are reviewed at length with the patient today.  Concerns regarding medicines are outlined above.  Tests Ordered: No orders of the defined types were placed in this encounter.  Medication Changes: No orders of the defined types were placed in this encounter.  Signed, Ermalinda Barrios, PA-C  06/05/2022 11:57 AM    [address] [place], [place], [place]  [address] Phone: [phone]; Fax: [phone]

## 2022-06-05 ENCOUNTER — Encounter: Payer: Self-pay | Admitting: Physician Assistant

## 2022-06-05 ENCOUNTER — Ambulatory Visit: Payer: Medicare Other | Attending: Physician Assistant | Admitting: Physician Assistant

## 2022-06-05 VITALS — BP 108/58 | HR 66 | Ht 67.0 in | Wt 120.0 lb

## 2022-06-05 DIAGNOSIS — Z720 Tobacco use: Secondary | ICD-10-CM | POA: Diagnosis present

## 2022-06-05 DIAGNOSIS — I10 Essential (primary) hypertension: Secondary | ICD-10-CM

## 2022-06-05 DIAGNOSIS — I2510 Atherosclerotic heart disease of native coronary artery without angina pectoris: Secondary | ICD-10-CM | POA: Diagnosis present

## 2022-06-05 DIAGNOSIS — E7849 Other hyperlipidemia: Secondary | ICD-10-CM | POA: Diagnosis present

## 2022-06-05 DIAGNOSIS — I6523 Occlusion and stenosis of bilateral carotid arteries: Secondary | ICD-10-CM

## 2022-06-05 NOTE — Patient Instructions (Signed)
Medication Instructions:  Your physician recommends that you continue on your current medications as directed. Please refer to the Current Medication list given to you today.   Labwork: None today  Testing/Procedures: None today  Follow-Up: 6 months  Any Other Special Instructions Will Be Listed Below (If Applicable).     STOP Smoking If you need a refill on your cardiac medications before your next appointment, please call your pharmacy.

## 2022-06-15 ENCOUNTER — Ambulatory Visit (HOSPITAL_COMMUNITY)
Admission: RE | Admit: 2022-06-15 | Discharge: 2022-06-15 | Disposition: A | Discharge status: Home / Self Care | Payer: Medicare Other | Source: Ambulatory Visit | Attending: Internal Medicine | Admitting: Internal Medicine

## 2022-06-15 ENCOUNTER — Other Ambulatory Visit: Payer: Self-pay | Admitting: Cardiology

## 2022-06-15 ENCOUNTER — Encounter (HOSPITAL_COMMUNITY): Payer: Self-pay

## 2022-06-15 DIAGNOSIS — M8588 Other specified disorders of bone density and structure, other site: Secondary | ICD-10-CM | POA: Diagnosis present

## 2022-06-15 DIAGNOSIS — Z1231 Encounter for screening mammogram for malignant neoplasm of breast: Secondary | ICD-10-CM | POA: Insufficient documentation

## 2022-07-07 LAB — COLOGUARD: COLOGUARD: POSITIVE — AB

## 2022-07-20 ENCOUNTER — Encounter: Payer: Self-pay | Admitting: *Deleted

## 2022-09-03 ENCOUNTER — Ambulatory Visit: Payer: Medicare Other | Admitting: Gastroenterology

## 2022-09-04 ENCOUNTER — Ambulatory Visit: Payer: Medicare Other | Admitting: Gastroenterology

## 2022-10-08 ENCOUNTER — Other Ambulatory Visit (HOSPITAL_COMMUNITY): Payer: Self-pay | Admitting: Family Medicine

## 2022-10-08 ENCOUNTER — Encounter (HOSPITAL_COMMUNITY): Payer: Self-pay | Admitting: Family Medicine

## 2022-10-08 ENCOUNTER — Ambulatory Visit (HOSPITAL_COMMUNITY)
Admission: RE | Admit: 2022-10-08 | Discharge: 2022-10-08 | Disposition: A | Discharge status: Home / Self Care | Payer: Medicare Other | Source: Ambulatory Visit | Attending: Family Medicine | Admitting: Family Medicine

## 2022-10-08 DIAGNOSIS — M25512 Pain in left shoulder: Secondary | ICD-10-CM

## 2022-10-14 ENCOUNTER — Other Ambulatory Visit: Payer: Self-pay | Admitting: Student

## 2022-10-16 ENCOUNTER — Encounter: Payer: Self-pay | Admitting: Gastroenterology

## 2022-10-16 NOTE — Progress Notes (Deleted)
Referring Provider: Celene Squibb, MD Primary Care Physician:  Celene Squibb, MD Primary Gastroenterologist:  Dr. Melony Overly previously here establishing with Dr. Jenetta Downer.  No chief complaint on file.   HPI:   Jill Perry is a 67 y.o. female presenting today at the request of Celene Squibb, MD for positive Cologuard which was completed 06/30/2022.    Past Medical History:  Diagnosis Date   CAD (coronary artery disease)    a. s/p NSTEMI in 07/2019 with DES to mid-RCA and DES to distal-RCA.    Hyperlipidemia LDL goal <70    Tobacco use     Past Surgical History:  Procedure Laterality Date   BREAST BIOPSY Left    X 2 both benign   CORONARY STENT INTERVENTION N/A 08/24/2019   Procedure: CORONARY STENT INTERVENTION;  Surgeon: Troy Sine, MD;  Location: [facility];  Service: Cardiovascular;  Laterality: N/A;   LEFT HEART CATH AND CORONARY ANGIOGRAPHY N/A 08/24/2019   Procedure: LEFT HEART CATH AND CORONARY ANGIOGRAPHY;  Surgeon: Troy Sine, MD;  Location: [facility];  Service: Cardiovascular;  Laterality: N/A;    Current Outpatient Medications  Medication Sig Dispense Refill   aspirin EC 81 MG EC tablet Take 1 tablet (81 mg total) by mouth daily. 30 tablet 11   atorvastatin (LIPITOR) 40 MG tablet TAKE 1 TABLET(40 MG) BY MOUTH DAILY 90 tablet 3   cholecalciferol (VITAMIN D3) 25 MCG (1000 UNIT) tablet Take 1,000 Units by mouth daily.     lisinopril (ZESTRIL) 2.5 MG tablet TAKE 1 TABLET(2.5 MG) BY MOUTH DAILY 90 tablet 3   No current facility-administered medications for this visit.    Allergies as of 10/18/2022   (No Known Allergies)    Family History  Problem Relation Age of Onset   Diabetes Mother    Hypertension Mother     Social History   Socioeconomic History   Marital status: Married    Spouse name: Not on file   Number of children: Not on file   Years of education: Not on file   Highest education level: Not on file  Occupational  History   Not on file  Tobacco Use   Smoking status: Every Day   Smokeless tobacco: Never  Vaping Use   Vaping Use: Never used  Substance and Sexual Activity   Alcohol use: Never   Drug use: Never   Sexual activity: Not Currently  Other Topics Concern   Not on file  Social History Narrative   Not on file   Social Determinants of Health   Financial Resource Strain: Not on file  Food Insecurity: Not on file  Transportation Needs: Not on file  Physical Activity: Not on file  Stress: Not on file  Social Connections: Not on file  Intimate Partner Violence: Not on file    Review of Systems: Gen: Denies any fever, chills, fatigue, weight loss, lack of appetite.  CV: Denies chest pain, heart palpitations, peripheral edema, syncope.  Resp: Denies shortness of breath at rest or with exertion. Denies wheezing or cough.  GI: Denies dysphagia or odynophagia. Denies jaundice, hematemesis, fecal incontinence. GU : Denies urinary burning, urinary frequency, urinary hesitancy MS: Denies joint pain, muscle weakness, cramps, or limitation of movement.  Derm: Denies rash, itching, dry skin Psych: Denies depression, anxiety, memory loss, and confusion Heme: Denies bruising, bleeding, and enlarged lymph nodes.  Physical Exam: There were no vitals taken for this visit. General:   Alert and  oriented. Pleasant and cooperative. Well-nourished and well-developed.  Head:  Normocephalic and atraumatic. Eyes:  Without icterus, sclera clear and conjunctiva pink.  Ears:  Normal auditory acuity. Lungs:  Clear to auscultation bilaterally. No wheezes, rales, or rhonchi. No distress.  Heart:  S1, S2 present without murmurs appreciated.  Abdomen:  +BS, soft, non-tender and non-distended. No HSM noted. No guarding or rebound. No masses appreciated.  Rectal:  Deferred  Msk:  Symmetrical without gross deformities. Normal posture. Extremities:  Without edema. Neurologic:  Alert and  oriented x4;  grossly  normal neurologically. Skin:  Intact without significant lesions or rashes. Psych:  Alert and cooperative. Normal mood and affect.    Assessment:     Plan:  ***   [name], PA-C [facility] 10/18/2022

## 2022-10-17 NOTE — Progress Notes (Unsigned)
Referring Provider:  Celene Squibb, MD Primary Care Physician:  Celene Squibb, MD Primary Gastroenterologist:  Dr. Melony Overly previously here establishing with Dr. Jenetta Downer.   No chief complaint on file.   HPI:   Jill Perry is a 67 y.o. female presenting today at the request of Celene Squibb, MD for positive Cologuard which was completed 06/30/2022.    Prior colonoscopy:  Past Medical History:  Diagnosis Date   CAD (coronary artery disease)    a. s/p NSTEMI in 07/2019 with DES to mid-RCA and DES to distal-RCA.    Carotid artery stenosis    Hyperlipidemia LDL goal <70    Tobacco use     Past Surgical History:  Procedure Laterality Date   BREAST BIOPSY Left    X 2 both benign   CORONARY STENT INTERVENTION N/A 08/24/2019   Procedure: CORONARY STENT INTERVENTION;  Surgeon: Troy Sine, MD;  Location: [facility];  Service: Cardiovascular;  Laterality: N/A;   LEFT HEART CATH AND CORONARY ANGIOGRAPHY N/A 08/24/2019   Procedure: LEFT HEART CATH AND CORONARY ANGIOGRAPHY;  Surgeon: Troy Sine, MD;  Location: [facility];  Service: Cardiovascular;  Laterality: N/A;    Current Outpatient Medications  Medication Sig Dispense Refill   aspirin EC 81 MG EC tablet Take 1 tablet (81 mg total) by mouth daily. 30 tablet 11   atorvastatin (LIPITOR) 40 MG tablet TAKE 1 TABLET(40 MG) BY MOUTH DAILY 90 tablet 3   cholecalciferol (VITAMIN D3) 25 MCG (1000 UNIT) tablet Take 1,000 Units by mouth daily.     lisinopril (ZESTRIL) 2.5 MG tablet TAKE 1 TABLET(2.5 MG) BY MOUTH DAILY 90 tablet 3   No current facility-administered medications for this visit.    Allergies as of 10/18/2022   (No Known Allergies)    Family History  Problem Relation Age of Onset   Diabetes Mother    Hypertension Mother     Social History   Socioeconomic History   Marital status: Married    Spouse name: Not on file   Number of children: Not on file   Years of education: Not on file    Highest education level: Not on file  Occupational History   Not on file  Tobacco Use   Smoking status: Every Day   Smokeless tobacco: Never  Vaping Use   Vaping Use: Never used  Substance and Sexual Activity   Alcohol use: Never   Drug use: Never   Sexual activity: Not Currently  Other Topics Concern   Not on file  Social History Narrative   Not on file   Social Determinants of Health   Financial Resource Strain: Not on file  Food Insecurity: Not on file  Transportation Needs: Not on file  Physical Activity: Not on file  Stress: Not on file  Social Connections: Not on file  Intimate Partner Violence: Not on file    Review of Systems: Gen: Denies any fever, chills, fatigue, weight loss, lack of appetite.  CV: Denies chest pain, heart palpitations, peripheral edema, syncope.  Resp: Denies shortness of breath at rest or with exertion. Denies wheezing or cough.  GI: Denies dysphagia or odynophagia. Denies jaundice, hematemesis, fecal incontinence. GU : Denies urinary burning, urinary frequency, urinary hesitancy MS: Denies joint pain, muscle weakness, cramps, or limitation of movement.  Derm: Denies rash, itching, dry skin Psych: Denies depression, anxiety, memory loss, and confusion Heme: Denies bruising, bleeding, and enlarged lymph nodes.  Physical Exam: There were  no vitals taken for this visit. General:   Alert and oriented. Pleasant and cooperative. Well-nourished and well-developed.  Head:  Normocephalic and atraumatic. Eyes:  Without icterus, sclera clear and conjunctiva pink.  Ears:  Normal auditory acuity. Lungs:  Clear to auscultation bilaterally. No wheezes, rales, or rhonchi. No distress.  Heart:  S1, S2 present without murmurs appreciated.  Abdomen:  +BS, soft, non-tender and non-distended. No HSM noted. No guarding or rebound. No masses appreciated.  Rectal:  Deferred  Msk:  Symmetrical without gross deformities. Normal posture. Extremities:  Without  edema. Neurologic:  Alert and  oriented x4;  grossly normal neurologically. Skin:  Intact without significant lesions or rashes. Psych:  Alert and cooperative. Normal mood and affect.    Assessment:     Plan:  ***   [name], PA-C [facility] 10/18/2022

## 2022-10-18 ENCOUNTER — Ambulatory Visit: Payer: Medicare Other | Admitting: Gastroenterology

## 2022-10-18 ENCOUNTER — Other Ambulatory Visit: Payer: Self-pay | Admitting: *Deleted

## 2022-10-18 ENCOUNTER — Encounter: Payer: Self-pay | Admitting: *Deleted

## 2022-10-18 ENCOUNTER — Telehealth: Payer: Self-pay | Admitting: *Deleted

## 2022-10-18 ENCOUNTER — Ambulatory Visit (INDEPENDENT_AMBULATORY_CARE_PROVIDER_SITE_OTHER): Payer: Medicare Other | Admitting: Gastroenterology

## 2022-10-18 ENCOUNTER — Encounter: Payer: Self-pay | Admitting: Gastroenterology

## 2022-10-18 VITALS — BP 114/85 | HR 71 | Temp 97.6°F | Ht 67.0 in | Wt 122.2 lb

## 2022-10-18 DIAGNOSIS — R195 Other fecal abnormalities: Secondary | ICD-10-CM | POA: Diagnosis not present

## 2022-10-18 MED ORDER — PEG 3350-KCL-NA BICARB-NACL 420 G PO SOLR: 4000.0000 mL | Freq: Once | ORAL | 0 refills | Status: AC

## 2022-10-18 NOTE — Patient Instructions (Signed)
We will arrange for you to have a colonoscopy with Dr. Jenetta Downer in the near future at [facility].   We will see you back as Dr. Jenetta Downer recommends.   [name], PA-C [facility]

## 2022-10-18 NOTE — Telephone Encounter (Signed)
Called pt and she is aware of pre-op appt details. She voiced understanding. 

## 2022-10-30 ENCOUNTER — Telehealth (INDEPENDENT_AMBULATORY_CARE_PROVIDER_SITE_OTHER): Payer: Self-pay | Admitting: Gastroenterology

## 2022-10-30 NOTE — Telephone Encounter (Signed)
Pt called in and needing to change pre op appt. Gave numbers to pre op.

## 2022-11-01 ENCOUNTER — Encounter (HOSPITAL_COMMUNITY): Admission: RE | Admit: 2022-11-01 | Payer: Medicare Other | Source: Ambulatory Visit

## 2022-11-01 NOTE — Patient Instructions (Signed)
Jill Perry  11/01/2022     @PREFPERIOPPHARMACY @   Your procedure is scheduled on  11/05/2022.   Report to Jill Perry at  0830 A.M.   Call this number if you have problems the morning of surgery:  [phone]  If you experience any cold or flu symptoms such as cough, fever, chills, shortness of breath, etc. between now and your scheduled surgery, please notify us at the above number.   Remember:  Follow the diet and prep instructions given to you by the office.     Take these medicines the morning of surgery with A SIP OF WATER                                                None.     Do not wear jewelry, make-up or nail polish.  Do not wear lotions, powders, or perfumes, or deodorant.  Do not shave 48 hours prior to surgery.  Men may shave face and neck.  Do not bring valuables to the hospital.  [facility] is not responsible for any belongings or valuables.  Contacts, dentures or bridgework may not be worn into surgery.  Leave your suitcase in the car.  After surgery it may be brought to your room.  For patients admitted to the hospital, discharge time will be determined by your treatment team.  Patients discharged the day of surgery will not be allowed to drive home and must have someone with them for 24 hours.    Special instructions:   DO NOT smoke tobacco or vape for 24 hours before your procedure.  Please read over the following fact sheets that you were given. Anesthesia Post-op Instructions and Care and Recovery After Surgery      Colonoscopy, Adult, Care After The following information offers guidance on how to care for yourself after your procedure. Your health care provider may also give you more specific instructions. If you have problems or questions, contact your health care provider. What can I expect after the procedure? After the procedure, it is common to have: A small amount of blood in your stool for 24 hours after the  procedure. Some gas. Mild cramping or bloating of your abdomen. Follow these instructions at home: Eating and drinking  Drink enough fluid to keep your urine pale yellow. Follow instructions from your health care provider about eating or drinking restrictions. Resume your normal diet as told by your health care provider. Avoid heavy or fried foods that are hard to digest. Activity Rest as told by your health care provider. Avoid sitting for a long time without moving. Get up to take short walks every 1-2 hours. This is important to improve blood flow and breathing. Ask for help if you feel weak or unsteady. Return to your normal activities as told by your health care provider. Ask your health care provider what activities are safe for you. Managing cramping and bloating  Try walking around when you have cramps or feel bloated. If directed, apply heat to your abdomen as told by your health care provider. Use the heat source that your health care provider recommends, such as a moist heat pack or a heating pad. Place a towel between your skin and the heat source. Leave the heat on for 20-30 minutes. Remove the heat if your skin turns  bright red. This is especially important if you are unable to feel pain, heat, or cold. You have a greater risk of getting burned. General instructions If you were given a sedative during the procedure, it can affect you for several hours. Do not drive or operate machinery until your health care provider says that it is safe. For the first 24 hours after the procedure: Do not sign important documents. Do not drink alcohol. Do your regular daily activities at a slower pace than normal. Eat soft foods that are easy to digest. Take over-the-counter and prescription medicines only as told by your health care provider. Keep all follow-up visits. This is important. Contact a health care provider if: You have blood in your stool 2-3 days after the procedure. Get  help right away if: You have more than a small spotting of blood in your stool. You have large blood clots in your stool. You have swelling of your abdomen. You have nausea or vomiting. You have a fever. You have increasing pain in your abdomen that is not relieved with medicine. These symptoms may be an emergency. Get help right away. Call 911. Do not wait to see if the symptoms will go away. Do not drive yourself to the hospital. Summary After the procedure, it is common to have a small amount of blood in your stool. You may also have mild cramping and bloating of your abdomen. If you were given a sedative during the procedure, it can affect you for several hours. Do not drive or operate machinery until your health care provider says that it is safe. Get help right away if you have a lot of blood in your stool, nausea or vomiting, a fever, or increased pain in your abdomen. This information is not intended to replace advice given to you by your health care provider. Make sure you discuss any questions you have with your health care provider. Document Revised: 03/01/2021 Document Reviewed: 03/01/2021 Elsevier Patient Education  [place] After The following information offers guidance on how to care for yourself after your procedure. Your health care provider may also give you more specific instructions. If you have problems or questions, contact your health care provider. What can I expect after the procedure? After the procedure, it is common to have: Tiredness. Little or no memory about what happened during or after the procedure. Impaired judgment when it comes to making decisions. Nausea or vomiting. Some trouble with balance. Follow these instructions at home: For the time period you were told by your health care provider:  Rest. Do not participate in activities where you could fall or become injured. Do not drive or use machinery. Do  not drink alcohol. Do not take sleeping pills or medicines that cause drowsiness. Do not make important decisions or sign legal documents. Do not take care of children on your own. Medicines Take over-the-counter and prescription medicines only as told by your health care provider. If you were prescribed antibiotics, take them as told by your health care provider. Do not stop using the antibiotic even if you start to feel better. Eating and drinking Follow instructions from your health care provider about what you may eat and drink. Drink enough fluid to keep your urine pale yellow. If you vomit: Drink clear fluids slowly and in small amounts as you are able. Clear fluids include water, ice chips, low-calorie sports drinks, and fruit juice that has water added to it (diluted fruit juice). Eat  light and bland foods in small amounts as you are able. These foods include bananas, applesauce, rice, lean meats, toast, and crackers. General instructions  Have a responsible adult stay with you for the time you are told. It is important to have someone help care for you until you are awake and alert. If you have sleep apnea, surgery and some medicines can increase your risk for breathing problems. Follow instructions from your health care provider about wearing your sleep device: When you are sleeping. This includes during daytime naps. While taking prescription pain medicines, sleeping medicines, or medicines that make you drowsy. Do not use any products that contain nicotine or tobacco. These products include cigarettes, chewing tobacco, and vaping devices, such as e-cigarettes. If you need help quitting, ask your health care provider. Contact a health care provider if: You feel nauseous or vomit every time you eat or drink. You feel light-headed. You are still sleepy or having trouble with balance after 24 hours. You get a rash. You have a fever. You have redness or swelling around the IV  site. Get help right away if: You have trouble breathing. You have new confusion after you get home. These symptoms may be an emergency. Get help right away. Call 911. Do not wait to see if the symptoms will go away. Do not drive yourself to the hospital. This information is not intended to replace advice given to you by your health care provider. Make sure you discuss any questions you have with your health care provider. Document Revised: 12/04/2021 Document Reviewed: 12/04/2021 Elsevier Patient Education  [place].

## 2022-11-02 ENCOUNTER — Encounter (HOSPITAL_COMMUNITY): Payer: Self-pay

## 2022-11-02 ENCOUNTER — Other Ambulatory Visit: Payer: Self-pay

## 2022-11-02 ENCOUNTER — Encounter (HOSPITAL_COMMUNITY)
Admission: RE | Admit: 2022-11-02 | Discharge: 2022-11-02 | Disposition: A | Discharge status: Home / Self Care | Payer: Medicare Other | Source: Ambulatory Visit | Attending: Gastroenterology | Admitting: Gastroenterology

## 2022-11-02 VITALS — Ht 67.0 in | Wt 199.0 lb

## 2022-11-02 DIAGNOSIS — Z01810 Encounter for preprocedural cardiovascular examination: Secondary | ICD-10-CM | POA: Insufficient documentation

## 2022-11-02 DIAGNOSIS — Z01818 Encounter for other preprocedural examination: Secondary | ICD-10-CM

## 2022-11-02 HISTORY — DX: Acute myocardial infarction, unspecified: I21.9

## 2022-11-02 HISTORY — DX: Unspecified osteoarthritis, unspecified site: M19.90

## 2022-11-04 ENCOUNTER — Encounter (HOSPITAL_COMMUNITY): Payer: Self-pay | Admitting: Anesthesiology

## 2022-11-05 ENCOUNTER — Encounter (HOSPITAL_COMMUNITY): Admission: RE | Payer: Self-pay | Source: Home / Self Care

## 2022-11-05 ENCOUNTER — Ambulatory Visit (HOSPITAL_COMMUNITY): Admission: RE | Admit: 2022-11-05 | Payer: Medicare Other | Source: Home / Self Care | Admitting: Gastroenterology

## 2022-11-05 DIAGNOSIS — R195 Other fecal abnormalities: Secondary | ICD-10-CM

## 2022-11-05 SURGERY — COLONOSCOPY WITH PROPOFOL
Anesthesia: Monitor Anesthesia Care

## 2022-11-09 ENCOUNTER — Telehealth (INDEPENDENT_AMBULATORY_CARE_PROVIDER_SITE_OTHER): Payer: Self-pay | Admitting: Gastroenterology

## 2022-11-09 NOTE — Telephone Encounter (Signed)
Pt left voicemail wanting a call back.  Returned call to pt. Pt states she had to cancel procedure due to getting extremely weak after drinking prep. Pt has rescheduled to 12/06/22. Pt would like lower volume prep. Will put Clenpiq samples up front along with instructions. Pt states she will come by to pick up sample next time they come to [place].

## 2022-11-16 ENCOUNTER — Ambulatory Visit (HOSPITAL_COMMUNITY)
Admission: RE | Admit: 2022-11-16 | Discharge: 2022-11-16 | Disposition: A | Discharge status: Home / Self Care | Payer: Medicare Other | Source: Ambulatory Visit | Attending: Student | Admitting: Student

## 2022-11-16 DIAGNOSIS — I6523 Occlusion and stenosis of bilateral carotid arteries: Secondary | ICD-10-CM

## 2022-11-21 ENCOUNTER — Encounter: Payer: Self-pay | Admitting: Internal Medicine

## 2022-11-30 ENCOUNTER — Encounter: Payer: Self-pay | Admitting: Cardiology

## 2022-11-30 ENCOUNTER — Ambulatory Visit: Payer: Medicare Other | Attending: Cardiology | Admitting: Cardiology

## 2022-11-30 VITALS — BP 114/64 | HR 66 | Ht 67.0 in | Wt 119.0 lb

## 2022-11-30 DIAGNOSIS — I6523 Occlusion and stenosis of bilateral carotid arteries: Secondary | ICD-10-CM | POA: Diagnosis not present

## 2022-11-30 DIAGNOSIS — I2510 Atherosclerotic heart disease of native coronary artery without angina pectoris: Secondary | ICD-10-CM | POA: Diagnosis not present

## 2022-11-30 DIAGNOSIS — E782 Mixed hyperlipidemia: Secondary | ICD-10-CM | POA: Diagnosis not present

## 2022-11-30 MED ORDER — ATORVASTATIN CALCIUM 40 MG PO TABS: ORAL_TABLET | ORAL | 3 refills | Status: DC

## 2022-11-30 MED ORDER — LISINOPRIL 2.5 MG PO TABS: ORAL_TABLET | ORAL | 3 refills | Status: DC

## 2022-11-30 NOTE — Patient Instructions (Signed)
Medication Instructions:  Your physician recommends that you continue on your current medications as directed. Please refer to the Current Medication list given to you today.  *If you need a refill on your cardiac medications before your next appointment, please call your pharmacy*   Lab Work: None If you have labs (blood work) drawn today and your tests are completely normal, you will receive your results only by: MyChart Message (if you have MyChart) OR A paper copy in the mail If you have any lab test that is abnormal or we need to change your treatment, we will call you to review the results.   Testing/Procedures: None   Follow-Up: At [facility], you and your health needs are our priority.  As part of our continuing mission to provide you with exceptional heart care, we have created designated Provider Care Teams.  These Care Teams include your primary Cardiologist (physician) and Advanced Practice Providers (APPs -  Physician Assistants and Nurse Practitioners) who all work together to provide you with the care you need, when you need it.  We recommend signing up for the patient portal called "MyChart".  Sign up information is provided on this After Visit Summary.  MyChart is used to connect with patients for Virtual Visits (Telemedicine).  Patients are able to view lab/test results, encounter notes, upcoming appointments, etc.  Non-urgent messages can be sent to your provider as well.   To learn more about what you can do with MyChart, go to [email or web address].    Your next appointment:   1 year(s)  Provider:   Jonathan Branch, MD    Other Instructions    

## 2022-11-30 NOTE — Progress Notes (Signed)
Clinical Summary Jill Perry is a 67 y.o.female seen today for follow up of the following medical problems.    1. CAD - admission 08/2019 with NSTEMI - peak hstrop 1300 - cath as reported below, received DES x2 to RCA, residual LAD/diag disease treated medically - Jan 2021 echo 60-65%, no WMAs, - diffiuclty affording brillinta, changed to plavix - lopressor stopped due to bradycardia       - no chest pains, no SOB/DOE -she is compliant with meds     2. Hyperlipidemia - started atorva 80mg  dur ing recent admission. LDL at that time was 171 - repeat LDL down to 63 - atorva was lowered to 40mg  daily due to elevation of LFTs (AST 98 ALT 170 alk phos 224)    - 10/2022 TC 137 TG 93 hDL 52 LDL 68   3. Tobacco - working to quit   4. Carotid stenosis 10/2022 RICA 50-69%, LICA minimal plaque Past Medical History:  Diagnosis Date   Arthritis    CAD (coronary artery disease)    a. s/p NSTEMI in 07/2019 with DES to mid-RCA and DES to distal-RCA.    Carotid artery stenosis    Hyperlipidemia LDL goal <70    Myocardial infarction (HCC)    Tobacco use      No Known Allergies   Current Outpatient Medications  Medication Sig Dispense Refill   aspirin EC 81 MG EC tablet Take 1 tablet (81 mg total) by mouth daily. 30 tablet 11   atorvastatin (LIPITOR) 40 MG tablet TAKE 1 TABLET(40 MG) BY MOUTH DAILY 90 tablet 3   Calcium Carb-Cholecalciferol (CALCIUM 600 + D PO) Take 1 tablet by mouth daily.     Cholecalciferol (VITAMIN D) 50 MCG (2000 UT) tablet Take 2,000 Units by mouth daily.     lisinopril (ZESTRIL) 2.5 MG tablet TAKE 1 TABLET(2.5 MG) BY MOUTH DAILY 90 tablet 3   No current facility-administered medications for this visit.     Past Surgical History:  Procedure Laterality Date   BREAST BIOPSY Left    X 2 both benign   CORONARY STENT INTERVENTION N/A 08/24/2019   Procedure: CORONARY STENT INTERVENTION;  Surgeon: Lennette Bihari, MD;  Location: [facility];   Service: Cardiovascular;  Laterality: N/A;   LEFT HEART CATH AND CORONARY ANGIOGRAPHY N/A 08/24/2019   Procedure: LEFT HEART CATH AND CORONARY ANGIOGRAPHY;  Surgeon: Lennette Bihari, MD;  Location: [facility];  Service: Cardiovascular;  Laterality: N/A;     No Known Allergies    Family History  Problem Relation Age of Onset   Diabetes Mother    Hypertension Mother    Colon cancer Neg Hx      Social History Ms. Nunn reports that she has been smoking cigarettes. She has been smoking an average of .5 packs per day. She has never used smokeless tobacco. Ms. Youngren reports no history of alcohol use.   Review of Systems CONSTITUTIONAL: No weight loss, fever, chills, weakness or fatigue.  HEENT: Eyes: No visual loss, blurred vision, double vision or yellow sclerae.No hearing loss, sneezing, congestion, runny nose or sore throat.  SKIN: No rash or itching.  CARDIOVASCULAR: per hpi RESPIRATORY: No shortness of breath, cough or sputum.  GASTROINTESTINAL: No anorexia, nausea, vomiting or diarrhea. No abdominal pain or blood.  GENITOURINARY: No burning on urination, no polyuria NEUROLOGICAL: No headache, dizziness, syncope, paralysis, ataxia, numbness or tingling in the extremities. No change in bowel or bladder control.  MUSCULOSKELETAL: No muscle, back pain, joint pain or stiffness.  LYMPHATICS: No enlarged nodes. No history of splenectomy.  PSYCHIATRIC: No history of depression or anxiety.  ENDOCRINOLOGIC: No reports of sweating, cold or heat intolerance. No polyuria or polydipsia.  Marland Kitchen   Physical Examination Today's Vitals   11/30/22 1059  BP: 114/64  Pulse: 66  SpO2: 96%  Weight: 119 lb (54 kg)  Height: 5\' 7"  (1.702 m)   Body mass index is 18.64 kg/m.  Gen: resting comfortably, no acute distress HEENT: no scleral icterus, pupils equal round and reactive, no palptable cervical adenopathy,  CV: RRR, no m/rg, no jvd Resp: Clear to auscultation bilaterally GI:  abdomen is soft, non-tender, non-distended, normal bowel sounds, no hepatosplenomegaly MSK: extremities are warm, no edema.  Skin: warm, no rash Neuro:  no focal deficits Psych: appropriate affect   Diagnostic Studies  Jan 2021 echo 1. Left ventricular ejection fraction, by visual estimation, is 60 to  65%. The left ventricle has normal function. There is no left ventricular  hypertrophy.   2. The left ventricle has no regional wall motion abnormalities.   3. Global right ventricle has normal systolic function.The right  ventricular size is normal. No increase in right ventricular wall  thickness.   4. Left atrial size was normal.   5. Right atrial size was normal.   6. The mitral valve is normal in structure. No evidence of mitral valve  regurgitation. No evidence of mitral stenosis.   7. The tricuspid valve is normal in structure.   8. The tricuspid valve is normal in structure. Tricuspid valve  regurgitation is not demonstrated.   9. The aortic valve is tricuspid. Aortic valve regurgitation is not  visualized. No evidence of aortic valve sclerosis or stenosis.  10. The pulmonic valve was not well visualized. Pulmonic valve  regurgitation is not visualized.  11. The inferior vena cava is normal in size with greater than 50%  respiratory variability, suggesting right atrial pressure of 3 mmHg.     Feb 2021 cath Dist RCA lesion is 95% stenosed. Prox RCA to Mid RCA lesion is 70% stenosed. 1st Mrg lesion is 30% stenosed. Ost LAD to Mid LAD lesion is 35% stenosed. 3rd Diag lesion is 70% stenosed. Mid LAD lesion is 70% stenosed. Post intervention, there is a 0% residual stenosis. Post intervention, there is a 0% residual stenosis. Ost RCA to Prox RCA lesion is 20% stenosed. A stent was successfully placed. A stent was successfully placed.   Multi-vessel CAD with diffuse 30 to 35% stenoses in the proximal LAD prior to the first diagonal vessel with 70% focal stenosis  immediately after the first diagonal vessel and diffuse 70% ostial proximal stenosis in the second diagonal vessel; 30% circumflex marginal-1 stenosis; dominant RCA with mild ostial tapering with 70% diffuse mid stenosis and 95% focal stenosis just proximal to the PDA takeoff distally.   LVEDP 7 mmHg.   Successful PCI to the RCA with PTCA and ultimate insertion of a 2.0 x 12 mm Resolute Onyx DES stent postdilated 2.25 mm in the distal RCA immediately proximal to the PDA takeoff and PCI/DES stenting of the mid RCA with insertion of a 2.5 x 30 mm Resolute Onyx DES stent postdilated to 2.75 mm meters with the stenosis being reduced to 0%.   Post cath RECOMMENDATION: DAPT for minimum of 1 year.  Aggressive lipid-lowering therapy target LDL 60 or below.  Initial medical therapy for concomitant CAD involving the LAD and circumflex marginal vessel.  Smoking cessation is imperative.     05/2020 carotid US IMPRESSION: Right:   Heterogeneous and partially calcified plaque at the right carotid bifurcation contributes to 50%-69% stenosis by established duplex criteria.   Left:   Color duplex indicates moderate heterogeneous and calcified plaque, with no hemodynamically significant stenosis by duplex criteria in the extracranial cerebrovascular circulation.   Developing right subclavian artery stenosis which contributes to abnormal right vertebral artery waveform.       Assessment and Plan   1. CAD - off beta blocker due to bradycardia - no symptoms, continue current meds   2. Hyperliidemia - LDL at goal, continue current meds   3. Carotid stenosis - stable on recent US, repeat US next year      F/u 1 year    Antoine Poche, M.D.

## 2022-12-04 ENCOUNTER — Encounter (HOSPITAL_COMMUNITY): Payer: Self-pay

## 2022-12-04 ENCOUNTER — Encounter (HOSPITAL_COMMUNITY): Payer: Self-pay | Admitting: Anesthesiology

## 2022-12-04 ENCOUNTER — Other Ambulatory Visit: Payer: Self-pay

## 2022-12-04 ENCOUNTER — Encounter (HOSPITAL_COMMUNITY)
Admission: RE | Admit: 2022-12-04 | Discharge: 2022-12-04 | Disposition: A | Discharge status: Home / Self Care | Payer: Medicare Other | Source: Ambulatory Visit | Attending: Gastroenterology | Admitting: Gastroenterology

## 2022-12-06 ENCOUNTER — Ambulatory Visit (HOSPITAL_COMMUNITY): Admission: RE | Admit: 2022-12-06 | Payer: Medicare Other | Source: Home / Self Care | Admitting: Gastroenterology

## 2022-12-06 ENCOUNTER — Encounter (HOSPITAL_COMMUNITY): Admission: RE | Payer: Self-pay | Source: Home / Self Care

## 2022-12-06 DIAGNOSIS — R195 Other fecal abnormalities: Secondary | ICD-10-CM

## 2022-12-06 SURGERY — COLONOSCOPY WITH PROPOFOL
Anesthesia: Monitor Anesthesia Care

## 2022-12-06 NOTE — OR Nursing (Signed)
Patient called to cancel procedure because of a ride issue.

## 2023-05-14 ENCOUNTER — Other Ambulatory Visit (HOSPITAL_COMMUNITY): Payer: Self-pay | Admitting: Internal Medicine

## 2023-05-14 DIAGNOSIS — Z1231 Encounter for screening mammogram for malignant neoplasm of breast: Secondary | ICD-10-CM

## 2023-06-17 ENCOUNTER — Other Ambulatory Visit: Payer: Self-pay | Admitting: Cardiology

## 2023-06-17 ENCOUNTER — Encounter (HOSPITAL_COMMUNITY): Payer: Self-pay

## 2023-06-17 ENCOUNTER — Ambulatory Visit (HOSPITAL_COMMUNITY)
Admission: RE | Admit: 2023-06-17 | Discharge: 2023-06-17 | Disposition: A | Discharge status: Home / Self Care | Payer: Medicare Other | Source: Ambulatory Visit | Attending: Internal Medicine | Admitting: Internal Medicine

## 2023-06-17 DIAGNOSIS — Z1231 Encounter for screening mammogram for malignant neoplasm of breast: Secondary | ICD-10-CM | POA: Diagnosis present

## 2023-12-10 ENCOUNTER — Telehealth: Payer: Self-pay | Admitting: Cardiology

## 2023-12-10 MED ORDER — ATORVASTATIN CALCIUM 40 MG PO TABS: ORAL_TABLET | ORAL | 3 refills | Status: DC

## 2023-12-10 MED ORDER — LISINOPRIL 2.5 MG PO TABS: ORAL_TABLET | ORAL | 3 refills | Status: DC

## 2023-12-10 NOTE — Telephone Encounter (Signed)
*  STAT* If patient is at the pharmacy, call can be transferred to refill team.   1. Which medications need to be refilled? (please list name of each medication and dose if known) Lisinopril  and Atorvastatin    2. Would you like to learn more about the convenience, safety, & potential cost savings by using the [facility]?     3. Are you open to using the [facility] (Type [facility].    4. Which pharmacy/location (including street and city if local pharmacy) is medication to be sent to?[facility] [place]   5. Do they need a 30 day or 90 day supply?  Enough her appointment on 02-28-24

## 2023-12-10 NOTE — Telephone Encounter (Signed)
 Refilled as requested

## 2024-02-28 ENCOUNTER — Ambulatory Visit: Attending: Student | Admitting: Student

## 2024-02-28 ENCOUNTER — Encounter: Payer: Self-pay | Admitting: Student

## 2024-02-28 VITALS — BP 122/58 | HR 56 | Ht 67.0 in | Wt 119.8 lb

## 2024-02-28 DIAGNOSIS — I10 Essential (primary) hypertension: Secondary | ICD-10-CM | POA: Insufficient documentation

## 2024-02-28 DIAGNOSIS — I2510 Atherosclerotic heart disease of native coronary artery without angina pectoris: Secondary | ICD-10-CM | POA: Diagnosis present

## 2024-02-28 DIAGNOSIS — E785 Hyperlipidemia, unspecified: Secondary | ICD-10-CM | POA: Insufficient documentation

## 2024-02-28 DIAGNOSIS — I6523 Occlusion and stenosis of bilateral carotid arteries: Secondary | ICD-10-CM | POA: Insufficient documentation

## 2024-02-28 MED ORDER — LISINOPRIL 2.5 MG PO TABS: ORAL_TABLET | ORAL | 3 refills | Status: AC

## 2024-02-28 MED ORDER — ATORVASTATIN CALCIUM 40 MG PO TABS: ORAL_TABLET | ORAL | 3 refills | Status: AC

## 2024-02-28 NOTE — Progress Notes (Signed)
 Cardiology Office Note    Date:  02/28/2024  ID:  Jill Perry, DOB Jul 19, 1956, MRN [id number] Cardiologist: Jill Carrier, MD Cardiology APP:  Jill Perry HERO, PA-C { : History of Present Illness:    Jill Perry is a 68 y.o. female with past medical history of CAD (s/p DES to mid-RCA and DES to distal-RCA in 07/2019), HTN, HLD, carotid artery stenosis and tobacco use who presents to the office today for annual follow-up.  She was last examined by Dr. Alvan in 11/2022 and denied any recent chest pain or dyspnea on exertion at that time. She had previously been on Lopressor  but this was stopped due to bradycardia and she was continued on her current medical therapy with ASA 81 mg daily, Atorvastatin  40 mg daily (had elevated LFT's on higher dosing) and Lisinopril  2.5 mg daily.  In talking with the patient today, she reports overall doing well since her last office visit. She does not exercise but stays active in doing routine chores and yard work. Enjoys going thrift shopping. She denies any recent chest pain or dyspnea on exertion. No recent palpitations, orthopnea, PND or pitting edema. Reports good compliance with her current medications. She does try to follow a heart healthy diet and does not consume butter/margarine, fried foods or gravy. Has reduced her tobacco use to a few cigarettes each day.   Studies Reviewed:   EKG: EKG is ordered today and demonstrates:   EKG Interpretation Date/Time:  Friday February 28 2024 14:00:40 EDT Ventricular Rate:  62 PR Interval:  154 QRS Duration:  84 QT Interval:  434 QTC Calculation: 440 R Axis:   79  Text Interpretation: Normal sinus rhythm When compared with ECG of 02-Nov-2022 11:11, No significant change was found Confirmed by Jill Perry ([id number]) on 02/28/2024 2:04:28 PM       Cardiac Catheterization: 08/2019 Dist RCA lesion is 95% stenosed. Prox RCA to Mid RCA lesion is 70% stenosed. 1st Mrg lesion is 30%  stenosed. Ost LAD to Mid LAD lesion is 35% stenosed. 3rd Diag lesion is 70% stenosed. Mid LAD lesion is 70% stenosed. Post intervention, there is a 0% residual stenosis. Post intervention, there is a 0% residual stenosis. Ost RCA to Prox RCA lesion is 20% stenosed. A stent was successfully placed. A stent was successfully placed.   Multi-vessel CAD with diffuse 30 to 35% stenoses in the proximal LAD prior to the first diagonal vessel with 70% focal stenosis immediately after the first diagonal vessel and diffuse 70% ostial proximal stenosis in the second diagonal vessel; 30% circumflex marginal-1 stenosis; dominant RCA with mild ostial tapering with 70% diffuse mid stenosis and 95% focal stenosis just proximal to the PDA takeoff distally.   LVEDP 7 mmHg.   Successful PCI to the RCA with PTCA and ultimate insertion of a 2.0 x 12 mm Resolute Onyx DES stent postdilated 2.25 mm in the distal RCA immediately proximal to the PDA takeoff and PCI/DES stenting of the mid RCA with insertion of a 2.5 x 30 mm Resolute Onyx DES stent postdilated to 2.75 mm meters with the stenosis being reduced to 0%.   RECOMMENDATION: DAPT for minimum of 1 year.  Aggressive lipid-lowering therapy target LDL 60 or below.  Initial medical therapy for concomitant CAD involving the LAD and circumflex marginal vessel.  Smoking cessation is imperative.    Carotid Dopplers: 10/2022 IMPRESSION: Right:   Heterogeneous and partially calcified plaque at the right carotid bifurcation contributes to 50%-69% stenosis by established duplex  criteria.   Left:   Color duplex indicates minimal heterogeneous and calcified plaque, with no hemodynamically significant stenosis by duplex criteria in the extracranial cerebrovascular circulation.   Waveform of the right vertebral artery unchanged indicating proximal right subclavian artery stenosis. Correlation with office based bilateral upper extremity blood pressure cuff  measurement may be useful.  Physical Exam:   VS:  BP (!) 122/58 (BP Location: Left Arm, Cuff Size: Normal)   Pulse (!) 56   Ht 5' 7 (1.702 m)   Wt 119 lb 12.8 oz (54.3 kg)   SpO2 100%   BMI 18.76 kg/m    Wt Readings from Last 3 Encounters:  02/28/24 119 lb 12.8 oz (54.3 kg)  12/04/22 119 lb (54 kg)  11/30/22 119 lb (54 kg)     GEN: Well nourished, well developed female appearing in no acute distress NECK: No JVD; No carotid bruits CARDIAC: RRR, no murmurs, rubs, gallops RESPIRATORY:  Clear to auscultation without rales, wheezing or rhonchi  ABDOMEN: Appears non-distended. No obvious abdominal masses. EXTREMITIES: No clubbing or cyanosis. No pitting edema.  Distal pedal pulses are 2+ bilaterally.   Assessment and Plan:   1. Coronary artery disease involving native coronary artery of native heart without angina pectoris - She previously underwent DES to mid-RCA and DES to distal-RCA in 07/2019. She remains active at baseline and denies any recent anginal symptoms. EKG today without acute ST changes. - Continue ASA 81 mg daily and Atorvastatin  40 mg daily. Not on a beta-blocker due to baseline bradycardia.   2. Bilateral carotid artery stenosis - Dopplers in 10/2022 showed 50-69% RICA stenosis and minimal plaque along the LICA. Will plan for repeat carotid dopplers and she prefers to have these performed in 04/2024. Continue ASA 81 mg daily and Atorvastatin  40 mg daily.  3. Essential hypertension - BP is well-controlled at 122/58 during today's visit. Continue Lisinopril  2.5mg  daily. Creatinine was at 0.83 when checked in 12/2023 by review of [facility] DXA.  4. Hyperlipidemia LDL goal <70 - FLP in 12/2023 showed total cholesterol at 131 and LDL at 65. Continue current medical therapy with Atorvastatin  40mg  daily. Previously had elevated LFT's with higher dosing in the past.  Signed, Perry CHRISTELLA Qua, PA-C

## 2024-02-28 NOTE — Patient Instructions (Signed)
 Medication Instructions:  Your physician recommends that you continue on your current medications as directed. Please refer to the Current Medication list given to you today.  *If you need a refill on your cardiac medications before your next appointment, please call your pharmacy*  Lab Work: NONE   If you have labs (blood work) drawn today and your tests are completely normal, you will receive your results only by: MyChart Message (if you have MyChart) OR A paper copy in the mail If you have any lab test that is abnormal or we need to change your treatment, we will call you to review the results.  Testing/Procedures: Your physician has requested that you have a carotid duplex. This test is an ultrasound of the carotid arteries in your neck. It looks at blood flow through these arteries that supply the brain with blood. Allow one hour for this exam. There are no restrictions or special instructions.   Follow-Up: At [facility], you and your health needs are our priority.  As part of our continuing mission to provide you with exceptional heart care, our providers are all part of one team.  This team includes your primary Cardiologist (physician) and Advanced Practice Providers or APPs (Physician Assistants and Nurse Practitioners) who all work together to provide you with the care you need, when you need it.  Your next appointment:   1 year(s)  Provider:   Dorn Ross, MD or Laymon Qua, PA-C    We recommend signing up for the patient portal called MyChart.  Sign up information is provided on this After Visit Summary.  MyChart is used to connect with patients for Virtual Visits (Telemedicine).  Patients are able to view lab/test results, encounter notes, upcoming appointments, etc.  Non-urgent messages can be sent to your provider as well.   To learn more about what you can do with MyChart, go to [email or web address].   Other Instructions Thank you for choosing  [facility]!

## 2024-05-11 ENCOUNTER — Other Ambulatory Visit (HOSPITAL_COMMUNITY): Payer: Self-pay | Admitting: Internal Medicine

## 2024-05-11 DIAGNOSIS — Z1231 Encounter for screening mammogram for malignant neoplasm of breast: Secondary | ICD-10-CM

## 2024-05-27 ENCOUNTER — Encounter (INDEPENDENT_AMBULATORY_CARE_PROVIDER_SITE_OTHER): Payer: Self-pay | Admitting: Gastroenterology

## 2024-06-22 ENCOUNTER — Ambulatory Visit (HOSPITAL_COMMUNITY)
Admission: RE | Admit: 2024-06-22 | Discharge: 2024-06-22 | Disposition: A | Source: Ambulatory Visit | Attending: Student

## 2024-06-22 ENCOUNTER — Ambulatory Visit: Payer: Self-pay | Admitting: Student

## 2024-06-22 ENCOUNTER — Ambulatory Visit (HOSPITAL_COMMUNITY)
Admission: RE | Admit: 2024-06-22 | Discharge: 2024-06-22 | Disposition: A | Source: Ambulatory Visit | Attending: Internal Medicine | Admitting: Internal Medicine

## 2024-06-22 DIAGNOSIS — I6523 Occlusion and stenosis of bilateral carotid arteries: Secondary | ICD-10-CM

## 2024-06-22 DIAGNOSIS — Z1231 Encounter for screening mammogram for malignant neoplasm of breast: Secondary | ICD-10-CM | POA: Insufficient documentation

## 2024-08-07 ENCOUNTER — Other Ambulatory Visit (HOSPITAL_COMMUNITY): Payer: Self-pay | Admitting: Internal Medicine

## 2024-08-07 DIAGNOSIS — M810 Age-related osteoporosis without current pathological fracture: Secondary | ICD-10-CM

## 2024-08-07 DIAGNOSIS — M8580 Other specified disorders of bone density and structure, unspecified site: Secondary | ICD-10-CM
